# Patient Record
Sex: Female | Born: 1960 | Race: Black or African American | Hispanic: No | Smoking: Never smoker
Health system: Southern US, Community
[De-identification: ages and names within clinical notes are randomized; demographics above are authoritative.]

## PROBLEM LIST (undated history)

## (undated) DIAGNOSIS — R51 Headache: Secondary | ICD-10-CM

## (undated) DIAGNOSIS — F419 Anxiety disorder, unspecified: Secondary | ICD-10-CM

## (undated) DIAGNOSIS — E669 Obesity, unspecified: Secondary | ICD-10-CM

## (undated) DIAGNOSIS — R200 Anesthesia of skin: Secondary | ICD-10-CM

## (undated) DIAGNOSIS — Z8701 Personal history of pneumonia (recurrent): Secondary | ICD-10-CM

## (undated) DIAGNOSIS — Z9889 Other specified postprocedural states: Secondary | ICD-10-CM

## (undated) DIAGNOSIS — Z9119 Patient's noncompliance with other medical treatment and regimen: Secondary | ICD-10-CM

## (undated) DIAGNOSIS — E039 Hypothyroidism, unspecified: Secondary | ICD-10-CM

## (undated) DIAGNOSIS — E785 Hyperlipidemia, unspecified: Secondary | ICD-10-CM

## (undated) DIAGNOSIS — R112 Nausea with vomiting, unspecified: Secondary | ICD-10-CM

## (undated) DIAGNOSIS — M1990 Unspecified osteoarthritis, unspecified site: Secondary | ICD-10-CM

## (undated) DIAGNOSIS — Z8659 Personal history of other mental and behavioral disorders: Secondary | ICD-10-CM

## (undated) DIAGNOSIS — E7800 Pure hypercholesterolemia, unspecified: Secondary | ICD-10-CM

## (undated) DIAGNOSIS — R202 Paresthesia of skin: Secondary | ICD-10-CM

## (undated) DIAGNOSIS — T8859XA Other complications of anesthesia, initial encounter: Secondary | ICD-10-CM

## (undated) DIAGNOSIS — I10 Essential (primary) hypertension: Secondary | ICD-10-CM

## (undated) DIAGNOSIS — T4145XA Adverse effect of unspecified anesthetic, initial encounter: Secondary | ICD-10-CM

## (undated) DIAGNOSIS — K219 Gastro-esophageal reflux disease without esophagitis: Secondary | ICD-10-CM

## (undated) DIAGNOSIS — R002 Palpitations: Secondary | ICD-10-CM

## (undated) DIAGNOSIS — Z8719 Personal history of other diseases of the digestive system: Secondary | ICD-10-CM

## (undated) DIAGNOSIS — K859 Acute pancreatitis without necrosis or infection, unspecified: Secondary | ICD-10-CM

## (undated) DIAGNOSIS — Z91199 Patient's noncompliance with other medical treatment and regimen due to unspecified reason: Secondary | ICD-10-CM

## (undated) DIAGNOSIS — R519 Headache, unspecified: Secondary | ICD-10-CM

## (undated) HISTORY — DX: Obesity, unspecified: E66.9

## (undated) HISTORY — DX: Hyperlipidemia, unspecified: E78.5

## (undated) HISTORY — DX: Hypothyroidism, unspecified: E03.9

## (undated) HISTORY — DX: Anxiety disorder, unspecified: F41.9

## (undated) HISTORY — DX: Patient's noncompliance with other medical treatment and regimen: Z91.19

## (undated) HISTORY — DX: Patient's noncompliance with other medical treatment and regimen due to unspecified reason: Z91.199

## (undated) HISTORY — DX: Acute pancreatitis without necrosis or infection, unspecified: K85.90

## (undated) HISTORY — PX: ESOPHAGOGASTRODUODENOSCOPY: SHX1529

## (undated) HISTORY — DX: Palpitations: R00.2

## (undated) HISTORY — DX: Pure hypercholesterolemia, unspecified: E78.00

## (undated) HISTORY — DX: Essential (primary) hypertension: I10

---
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
## 1982-07-10 HISTORY — PX: TONSILLECTOMY: SUR1361

## 1989-07-10 HISTORY — PX: TOTAL ABDOMINAL HYSTERECTOMY: SHX209

## 1995-07-11 DIAGNOSIS — Z8701 Personal history of pneumonia (recurrent): Secondary | ICD-10-CM

## 1995-07-11 HISTORY — DX: Personal history of pneumonia (recurrent): Z87.01

## 2003-07-11 HISTORY — PX: CARDIAC CATHETERIZATION: SHX172

## 2004-10-27 ENCOUNTER — Encounter: Admission: RE | Admit: 2004-10-27 | Discharge: 2004-12-12 | Payer: Self-pay | Admitting: Family Medicine

## 2004-11-04 ENCOUNTER — Encounter: Admission: RE | Admit: 2004-11-04 | Discharge: 2004-11-04 | Payer: Self-pay | Admitting: Internal Medicine

## 2005-02-07 ENCOUNTER — Emergency Department (HOSPITAL_COMMUNITY): Admission: EM | Admit: 2005-02-07 | Discharge: 2005-02-07 | Payer: Self-pay | Admitting: Emergency Medicine

## 2006-07-10 DIAGNOSIS — Z8659 Personal history of other mental and behavioral disorders: Secondary | ICD-10-CM

## 2006-07-10 HISTORY — DX: Personal history of other mental and behavioral disorders: Z86.59

## 2006-11-22 ENCOUNTER — Emergency Department (HOSPITAL_COMMUNITY): Admission: EM | Admit: 2006-11-22 | Discharge: 2006-11-23 | Payer: Self-pay | Admitting: Emergency Medicine

## 2007-08-01 ENCOUNTER — Ambulatory Visit: Payer: Self-pay

## 2007-08-16 ENCOUNTER — Emergency Department: Payer: Self-pay | Admitting: Emergency Medicine

## 2008-01-30 ENCOUNTER — Ambulatory Visit: Payer: Self-pay | Admitting: Internal Medicine

## 2008-06-08 ENCOUNTER — Ambulatory Visit: Payer: Self-pay | Admitting: Internal Medicine

## 2008-07-25 ENCOUNTER — Emergency Department: Payer: Self-pay | Admitting: Emergency Medicine

## 2008-07-29 ENCOUNTER — Ambulatory Visit: Payer: Self-pay | Admitting: Internal Medicine

## 2008-07-30 ENCOUNTER — Ambulatory Visit: Payer: Self-pay | Admitting: Internal Medicine

## 2009-06-16 ENCOUNTER — Ambulatory Visit: Payer: Self-pay | Admitting: Internal Medicine

## 2009-06-16 DIAGNOSIS — I10 Essential (primary) hypertension: Secondary | ICD-10-CM

## 2009-06-16 DIAGNOSIS — R5383 Other fatigue: Secondary | ICD-10-CM

## 2009-06-16 DIAGNOSIS — E785 Hyperlipidemia, unspecified: Secondary | ICD-10-CM | POA: Insufficient documentation

## 2009-06-16 DIAGNOSIS — R002 Palpitations: Secondary | ICD-10-CM

## 2009-06-16 DIAGNOSIS — R5381 Other malaise: Secondary | ICD-10-CM

## 2009-06-20 ENCOUNTER — Emergency Department (HOSPITAL_COMMUNITY): Admission: EM | Admit: 2009-06-20 | Discharge: 2009-06-20 | Payer: Self-pay | Admitting: Emergency Medicine

## 2009-06-22 ENCOUNTER — Ambulatory Visit: Payer: Self-pay

## 2009-06-22 ENCOUNTER — Ambulatory Visit: Payer: Self-pay | Admitting: Cardiovascular Disease

## 2009-06-22 ENCOUNTER — Encounter: Payer: Self-pay | Admitting: Internal Medicine

## 2009-07-06 LAB — CONVERTED CEMR LAB
BUN: 13 mg/dL (ref 6–23)
CO2: 26 meq/L (ref 19–32)
Chloride: 102 meq/L (ref 96–112)
Cholesterol: 278 mg/dL — ABNORMAL HIGH (ref 0–200)
Creatinine, Ser: 0.88 mg/dL (ref 0.40–1.20)
Glucose, Bld: 100 mg/dL — ABNORMAL HIGH (ref 70–99)
HDL: 57 mg/dL (ref >39)
MCV: 95.8 fL (ref 78.0–100.0)
Platelets: 194 10*3/uL (ref 150–400)
RBC: 4.54 M/uL (ref 3.87–5.11)
TSH: 4.108 microintl units/mL (ref 0.350–4.500)
Total Bilirubin: 0.7 mg/dL (ref 0.3–1.2)
Total CHOL/HDL Ratio: 4.9
Total Protein: 7.7 g/dL (ref 6.0–8.3)
Triglycerides: 137 mg/dL (ref <150)
WBC: 6.2 10*3/uL (ref 4.0–10.5)

## 2009-07-10 HISTORY — PX: COLONOSCOPY: SHX174

## 2010-01-20 ENCOUNTER — Encounter: Admission: RE | Admit: 2010-01-20 | Discharge: 2010-01-20 | Payer: Self-pay | Admitting: Family Medicine

## 2010-12-07 ENCOUNTER — Emergency Department (HOSPITAL_COMMUNITY)
Admission: EM | Admit: 2010-12-07 | Discharge: 2010-12-07 | Disposition: A | Discharge status: Home / Self Care | Payer: Worker's Compensation | Attending: Emergency Medicine | Admitting: Emergency Medicine

## 2010-12-07 DIAGNOSIS — R05 Cough: Secondary | ICD-10-CM | POA: Insufficient documentation

## 2010-12-07 DIAGNOSIS — J45909 Unspecified asthma, uncomplicated: Secondary | ICD-10-CM | POA: Insufficient documentation

## 2010-12-07 DIAGNOSIS — I10 Essential (primary) hypertension: Secondary | ICD-10-CM | POA: Insufficient documentation

## 2010-12-07 DIAGNOSIS — R0602 Shortness of breath: Secondary | ICD-10-CM | POA: Insufficient documentation

## 2010-12-07 DIAGNOSIS — R059 Cough, unspecified: Secondary | ICD-10-CM | POA: Insufficient documentation

## 2011-01-26 ENCOUNTER — Encounter: Payer: Self-pay | Admitting: Cardiovascular Disease

## 2011-04-03 ENCOUNTER — Ambulatory Visit
Admission: RE | Admit: 2011-04-03 | Discharge: 2011-04-03 | Disposition: A | Discharge status: Home / Self Care | Payer: Worker's Compensation | Source: Ambulatory Visit | Attending: Internal Medicine | Admitting: Internal Medicine

## 2011-04-03 ENCOUNTER — Other Ambulatory Visit: Payer: Self-pay | Admitting: *Deleted

## 2011-04-03 DIAGNOSIS — R05 Cough: Secondary | ICD-10-CM

## 2011-04-03 DIAGNOSIS — R0602 Shortness of breath: Secondary | ICD-10-CM

## 2011-04-10 DIAGNOSIS — K859 Acute pancreatitis without necrosis or infection, unspecified: Secondary | ICD-10-CM

## 2011-04-10 HISTORY — DX: Acute pancreatitis without necrosis or infection, unspecified: K85.90

## 2011-04-25 ENCOUNTER — Inpatient Hospital Stay: Payer: Self-pay | Admitting: Student

## 2011-05-04 ENCOUNTER — Encounter: Payer: Self-pay | Admitting: Gastroenterology

## 2011-05-04 ENCOUNTER — Ambulatory Visit (INDEPENDENT_AMBULATORY_CARE_PROVIDER_SITE_OTHER): Payer: 59 | Admitting: Gastroenterology

## 2011-05-04 VITALS — BP 138/84 | HR 71 | Temp 97.1°F | Ht 63.0 in | Wt 247.2 lb

## 2011-05-04 DIAGNOSIS — K859 Acute pancreatitis without necrosis or infection, unspecified: Secondary | ICD-10-CM

## 2011-05-04 DIAGNOSIS — R197 Diarrhea, unspecified: Secondary | ICD-10-CM

## 2011-05-04 DIAGNOSIS — R7989 Other specified abnormal findings of blood chemistry: Secondary | ICD-10-CM

## 2011-05-04 DIAGNOSIS — R945 Abnormal results of liver function studies: Secondary | ICD-10-CM | POA: Insufficient documentation

## 2011-05-04 NOTE — Assessment & Plan Note (Signed)
Recent change in bowels with increased stool frequency, loose stools. In the setting of recent pancreatitis. 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 She will monitor her symptoms and if persisting over the next week, consider stool studies.

## 2011-05-04 NOTE — Patient Instructions (Addendum)
We will get copy of recent labs. I will review your records with Dr. Jena Gauss and make further recommendations.  Please call with worsening or persistent abdominal pain, fever, persistent diarrhea.

## 2011-05-04 NOTE — Assessment & Plan Note (Signed)
Acute pancreatitis requiring hospitalization on 04/25/2011. Lipase was over 1100. Minimally elevated AST and ALT, patient reports having that previously. Abdominal ultrasound showed a normal-appearing liver, pancreas not completely seen but part seen was normal, no gallstones, normal common bile duct diameter. She has started HCTZ back in August however states that she was somewhat noncompliant. This may be the source of her acute pancreatitis. She denies alcohol use, calcium level was normal. No family history of pancreatitis. Interestingly she has had intermittent abdominal pain for the past 2 months it is unclear whether these symptoms were related to her pancreas. Clinically she appears to be improved at this point. She will need to have further detailing of her pancreas to exclude pancreas divisum, pancreatic mass, consider excluding microlithiasis as well. She may very well need EGD to rule out other sources for chronic abdominal pain such as peptic ulcer disease or gastritis. I will re\re review recent lab work done after her hospitalization by her PCP. I will discuss these findings with Dr.Rourk and further recommendations to follow.  She will continue pantoprazole for now. She will continue low-fat diet. She's been instructed to call us with worsening abdominal pain, persistent diarrhea, fever, vomiting, melena, rectal bleeding.

## 2011-05-04 NOTE — Progress Notes (Signed)
Primary Care Physician:  Kermit Balo., DO  Primary Gastroenterologist:  Roetta Sessions, MD   Chief Complaint  Patient presents with  . Pancreatitis    HPI:  Joan Salazar is a 50 y.o. female here for further evaluation of recent diagnosis of acute pancreatitis requiring hospitalization at [facility]. She gives a two-month history of intermittent abdominal pain and gas. Burning into chest. Not like heartburn. Cardiac cath ok, 2005. No dysphagia. She states that her stomach were hurt when she thought about work. She admits to being under a lot of stress. About 2 weeks ago her pain intensified and she went to urgent care who transferred her to [facility]. She was diagnosed with acute pancreatitis. Her lipase was over 1000. Abdominal ultrasound was unremarkable. It's noted that she had started HCTZ and August but she states she has been somewhat noncompliant with the medication. It was felt that her pancreatitis may be due to the HCTZ. Her triglyceride level and calcium level both were normal. She does not consume alcohol.   She states over the last 2 months she's had increasing burning in the epigastrium. Symptoms worse with food. She was started on Protonix about 2 weeks prior to her hospitalization. She is also noted a change in her bowel movements prior to her hospitalization.  She went from having one to 2 stools a day up to 5-7 stools a day. She works at a long-term care facility as a Administrator, arts. Denies melena, rectal bleeding, fever.  Last antibiotics in 02/2011 for sinus infection. Bag of Levaquin during recent hospitalization.   Current Outpatient Prescriptions  Medication Sig Dispense Refill  . amLODipine (NORVASC) 10 MG tablet Take 10 mg by mouth daily.       Marland Kitchen levothyroxine (SYNTHROID, LEVOTHROID) 50 MCG tablet Take 50 mcg by mouth daily.        Marland Kitchen losartan (COZAAR) 50 MG tablet Take 50 mg by mouth daily.       . pantoprazole  (PROTONIX) 40 MG tablet Take 40 mg by mouth daily.       . VENTOLIN HFA 108 (90 BASE) MCG/ACT inhaler Inhale 1 puff into the lungs every 4 (four) hours as needed.         Allergies as of 05/04/2011  . (No Known Allergies)    Past Medical History  Diagnosis Date  . Hypertension   . Hyperlipidemia     stopped statins due to muscle pain  . Obesity   . Noncompliance   . Palpitations   . Hypothyroidism   . Acute pancreatitis 04/2011    ?secondary to HCTZ.     Past Surgical History  Procedure Date  . 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 Total abdominal hysterectomy 1991  . Tonsillectomy 1984  . Appendectomy 1979  . Colonoscopy 2011    Dr. Jeani Hawking. Per pt, colon polyps and due in five years for f/u.  Marland Kitchen Esophagogastroduodenoscopy 1990s    duodenitis per patient    Family History  Problem Relation Age of Onset  . Heart attack Mother   . Hypertension Father   . Hyperlipidemia Father   . Hypertension Sister   . Colon cancer Neg Hx   . Liver disease Neg Hx     History   Social History  . Marital Status: Divorced    Spouse Name: N/A    Number of Children: 1  . Years of Education: N/A   Occupational  History  . RN DIRECTOR  CLINICAL EDUCATION     [employer]   Social History Main Topics  . Smoking status: Never Smoker   . Smokeless tobacco: Not on file  . Alcohol Use: No  . Drug Use: No  . Sexually Active: Not on file   Other Topics Concern  . Not on file   Social History Narrative   Lives in [place].Has 1 son (age 17).      ROS:  General: Negative for anorexia, weight loss, fever, chills, fatigue, weakness. Eyes: Negative for vision changes.  ENT: Negative for hoarseness, difficulty swallowing , nasal congestion. CV: Negative for chest pain, angina, palpitations, dyspnea on exertion, peripheral edema.  Respiratory: Negative for dyspnea at rest, dyspnea on exertion, cough, sputum, wheezing.  GI:  See history of present illness. GU:  Negative for dysuria, hematuria, urinary incontinence, urinary frequency, nocturnal urination.  MS: Negative for joint pain, low back pain.  Derm: Negative for rash or itching.  Neuro: Negative for weakness, abnormal sensation, seizure, frequent headaches, memory loss, confusion.  Psych: Negative for anxiety, depression, suicidal ideation, hallucinations. Complains of being stressed out. Endo: Negative for unusual weight change.  Heme: Negative for bruising or bleeding. Allergy: Negative for rash or hives.    Physical Examination:  BP 138/84  Pulse 71  Temp(Src) 97.1 F (36.2 C) (Temporal)  Ht 5\' 3"  (1.6 m)  Wt 247 lb 3.2 oz (112.129 kg)  BMI 43.79 kg/m2   General: Well-nourished, well-developed in no acute distress.  Head: Normocephalic, atraumatic.   Eyes: Conjunctiva pink, no icterus. Mouth: Oropharyngeal mucosa moist and pink , no lesions erythema or exudate. Neck: Supple without thyromegaly, masses, or lymphadenopathy.  Lungs: Clear to auscultation bilaterally.  Heart: Regular rate and rhythm, no murmurs rubs or gallops.  Abdomen: Bowel sounds are normal, mild epigastric/right upper quadrant tenderness, nondistended, no hepatosplenomegaly or masses, no abdominal bruits or    hernia , no rebound or guarding.   Rectal: Not performed. Extremities: No lower extremity edema. No clubbing or deformities.  Neuro: Alert and oriented x 4 , grossly normal neurologically.  Skin: Warm and dry, no rash or jaundice.   Psych: Alert and cooperative, normal mood and affect.  Labs: Labs from 04/25/2011. White blood cell count 8100, hemoglobin 13.4, platelets 238, lipase 1110, glucose 85, BUN 10, creatinine 0.94, sodium 146, potassium 3.5, calcium 9, AST 43, ALT 43, albumin 4.1, total bilirubin 0.8. Labs from 04/26/2011. AST 41 ALT 46, alkaline phosphatase 67, total bilirubin 0.9, triglycerides 112, lipase 690. TSH 1.64.  Imaging Studies: Abdominal  ultrasound showed no gallstones or acute cholecystitis. Visualized portion of the pancreas was normal in echogenicity. Common bile duct 4.8 mm.

## 2011-05-04 NOTE — Assessment & Plan Note (Signed)
Minimally elevated AST, ALT. No obvious fatty liver on recent ultrasound. History of statin use in the past. Recheck in the near future and if remain elevated will continue workup.

## 2011-05-05 NOTE — Progress Notes (Signed)
Cc to PCP 

## 2011-05-08 ENCOUNTER — Telehealth: Payer: Self-pay | Admitting: Gastroenterology

## 2011-05-08 NOTE — Telephone Encounter (Signed)
Tried to call pt- NA 

## 2011-05-08 NOTE — Telephone Encounter (Signed)
Pt called, asking to speak to LL- she stated she was still having nausea & epigastric pain- please call and advise her on what she should do- she can be reached at [phone]

## 2011-05-09 ENCOUNTER — Other Ambulatory Visit: Payer: Self-pay | Admitting: Internal Medicine

## 2011-05-09 DIAGNOSIS — K861 Other chronic pancreatitis: Secondary | ICD-10-CM

## 2011-05-09 NOTE — Telephone Encounter (Signed)
Gastroenterology Phone Call Form  Primary Physician: rourk  1) Reason for phone call: epigastric pain and burning. Just like at ov.   2) When did this start? Still having the same symptoms since ov   3) Have you been seen for this here before?  yes    4) If Pain, where is it located?  the epigastrium   5) Rate your pain/discomfort on scale 1-10? 5   6) Have you tried any medication for this?yes  protonix   7) Do you have any trouble breathing, chest pain, vomiting blood, passing bloody stools, feel dehydrated, dizzy or have a high fever?no   Pt going to see PCP today. Wants to know if you have talked with RMR yet.

## 2011-05-09 NOTE — Telephone Encounter (Signed)
I was waiting for her last labs but never received them.  Let's have her repeat a lipase, CMET, CBC.

## 2011-05-10 ENCOUNTER — Ambulatory Visit
Admission: RE | Admit: 2011-05-10 | Discharge: 2011-05-10 | Disposition: A | Discharge status: Home / Self Care | Payer: 59 | Source: Ambulatory Visit | Attending: Internal Medicine | Admitting: Internal Medicine

## 2011-05-10 DIAGNOSIS — K861 Other chronic pancreatitis: Secondary | ICD-10-CM

## 2011-05-10 MED ORDER — IOHEXOL 300 MG/ML  SOLN: 125.0000 mL | Freq: Once | INTRAMUSCULAR | Status: AC | PRN

## 2011-05-10 NOTE — Telephone Encounter (Signed)
Spoke with pt, she was not home yesterday because she was at pcp. She said they sent her for an abd ct in [place]. She said it was imaging place on [address]. Advised pt that we would not get a copy of ct and asked that she request it be sent to [place].  Pt will go for labs. She requested to go to lab closer to [place], called [facility], the closest one is in [place] on [address]. Advised pt, she said she knew where it was at and would go have it done. [facility] lab, they said to fax order over and they would take care of it. Order faxed to  [phone]

## 2011-05-10 NOTE — Telephone Encounter (Signed)
Have we made contact with patient?

## 2011-05-11 ENCOUNTER — Other Ambulatory Visit: Payer: Self-pay | Admitting: Gastroenterology

## 2011-05-12 LAB — COMPREHENSIVE METABOLIC PANEL
AST: 39 U/L — ABNORMAL HIGH (ref 0–37)
BUN: 7 mg/dL (ref 6–23)
CO2: 24 mEq/L (ref 19–32)
Calcium: 9.7 mg/dL (ref 8.4–10.5)
Chloride: 105 mEq/L (ref 96–112)
Creat: 0.77 mg/dL (ref 0.50–1.10)
Total Bilirubin: 0.7 mg/dL (ref 0.3–1.2)

## 2011-05-12 LAB — CBC WITH DIFFERENTIAL/PLATELET
Basophils Absolute: 0 10*3/uL (ref 0.0–0.1)
Basophils Relative: 0 % (ref 0–1)
Eosinophils Relative: 3 % (ref 0–5)
HCT: 43.7 % (ref 36.0–46.0)
Lymphs Abs: 1.9 10*3/uL (ref 0.7–4.0)
Monocytes Absolute: 0.5 10*3/uL (ref 0.1–1.0)
Monocytes Relative: 7 % (ref 3–12)
RBC: 4.52 MIL/uL (ref 3.87–5.11)
RDW: 13.6 % (ref 11.5–15.5)

## 2011-05-12 LAB — LIPASE: Lipase: 34 U/L (ref 0–75)

## 2011-05-16 ENCOUNTER — Encounter: Payer: Self-pay | Admitting: Urgent Care

## 2011-05-16 ENCOUNTER — Ambulatory Visit (INDEPENDENT_AMBULATORY_CARE_PROVIDER_SITE_OTHER): Payer: 59 | Admitting: Urgent Care

## 2011-05-16 DIAGNOSIS — K859 Acute pancreatitis without necrosis or infection, unspecified: Secondary | ICD-10-CM

## 2011-05-16 DIAGNOSIS — R109 Unspecified abdominal pain: Secondary | ICD-10-CM | POA: Insufficient documentation

## 2011-05-16 MED ORDER — PANTOPRAZOLE SODIUM 40 MG PO TBEC: 40.0000 mg | DELAYED_RELEASE_TABLET | Freq: Two times a day (BID) | ORAL | Status: DC

## 2011-05-16 NOTE — Patient Instructions (Addendum)
Be sure to get a mammogram through REED,TIFFANY L., DO as one breast was a bit irregular on CT Go get your labs now Clear liquids for 24 hrs EGD as planned You may use dilaudid you have at home for pain medication as directed  Abdominal Pain Abdominal pain can be caused by many things. Your caregiver decides the seriousness of your pain by an examination and possibly blood tests and X-rays. Many cases can be observed and treated at home. Most abdominal pain is not caused by a disease and will probably improve without treatment. However, in many cases, more time must pass before a clear cause of the pain can be found. Before that point, it may not be known if you need more testing, or if hospitalization or surgery is needed. HOME CARE INSTRUCTIONS   Do not take laxatives unless directed by your caregiver.   Take pain medicine only as directed by your caregiver.   Only take over-the-counter or prescription medicines for pain, discomfort, or fever as directed by your caregiver.   Try a clear liquid diet (broth, tea, or water) for as long as directed by your caregiver. Slowly move to a bland diet as tolerated.  SEEK IMMEDIATE MEDICAL CARE IF:   The pain does not go away.   You have a fever.   You keep throwing up (vomiting).   The pain is felt only in portions of the abdomen. Pain in the right side could possibly be appendicitis. In an adult, pain in the left lower portion of the abdomen could be colitis or diverticulitis.   You pass bloody or black tarry stools.  MAKE SURE YOU:   Understand these instructions.   Will watch your condition.   Will get help right away if you are not doing well or get worse.  Document Released: 04/05/2005 Document Revised: 03/08/2011 Document Reviewed: 02/12/2008 [facility] Patient Information 2012 [place], [place].

## 2011-05-16 NOTE — Assessment & Plan Note (Addendum)
Recurrent abdominal pain. Mostly right upper quadrant/epigastric area. Recent acute pancreatitis elevated lipase,however this was not confirmed on imaging. Gallbladder and pancreas were normal on ultrasound and CT. Nothing to explain pain. She is going to need further evaluation with EGD to rule out peptic ulcer disease, gastritis and duodenitis.  I have discussed risks & benefits which include, but are not limited to, bleeding, infection, perforation & drug reaction.  The patient agrees with this plan & written consent will be obtained.    Recheck CBC, LFTs, and lipase now Clear liquid diet Increase Protonix to 40 mg BID

## 2011-05-16 NOTE — Progress Notes (Signed)
Cc to PCP 

## 2011-05-16 NOTE — Progress Notes (Signed)
Referring Provider: Kermit Balo., DO Primary Care Physician:  Kermit Balo., DO Primary Gastroenterologist:  Dr. Jena Gauss  Chief Complaint  Patient presents with  . Abdominal Pain    with back pain  . Pancreatitis    HPI:  Joan Salazar is a 50 y.o. female here for follow up for abdominal pain.  Hx acute pancreatitis requiring hospitalization on 04/25/2011-04/28/2011 at [facility].  Lipase was over 1100. Minimally elevated AST and ALT.  Recent lipase, LFTs, CBC & CT abd/pelvis w/ IV contrast do not show any pancreatic changes.  Previous abdominal ultrasound showed a normal-appearing liver, pancreas not completely seen but part seen was normal, no gallstones, normal common bile duct diameter. Pancreatitis was felt to possibly be due to HCTZ.   This has since been discontinued.  She denies alcohol use.  Was seen at PCP and referred back because of her severe pain today.  C/o chronic RUQ pain radiates to back.  Pain 3/10 on scale. 7/10 at worst.  Constant.  Not associated w/ meals.  No fever or chills.  C/o a little nausea, without vomiting.  Denies rectal bleeding or melena.  Heartburn yesterday. Usually heartburn 2-3 episodes per week.  Started taking protonix 40mg  daily 2 weeks ago.  Some help w/ heartburn, but not pain.   Denies dysphagia or odyynophagia.  BM every day 2-3 per day.  No constipation.  Loose stools.  05/10/11 CT abdomen and pelvis with IV contrast->Fatty infiltration of the liver. No pancreatic mass or pancreatic inflammation detected. Atherosclerotic type changes aorta and iliac arteries. Degenerative changes lower lumbar spine.  Prominent lobulation/scarring upper pole of the left kidney. Slightly asymmetric breast tissue. Correlation mammography recommended.  Lab Summary Latest Ref Rng 05/11/2011  Hemoglobin 12.0 - 15.0 g/dL 40.9  Hematocrit 81.1 - 46.0 % 43.7  White count 4.0 - 10.5 K/uL 7.8  Platelet count 150 - 400 K/uL 263  Sodium 135 - 145 mEq/L 142  Potassium 3.5 - 5.3  mEq/L 4.1  Calcium 8.4 - 10.5 mg/dL 9.7  Phosphorus  (None)  Creatinine 0.50 - 1.10 mg/dL 9.14  AST 0 - 37 U/L 39 (H)  Alk Phos 39 - 117 U/L 78  Bilirubin 0.3 - 1.2 mg/dL 0.7  Glucose 70 - 99 mg/dL 782 (H)  Cholesterol 9-562 mg/dL (None)  HDL cholesterol >39 mg/dL (None)  Triglycerides <130 mg/dL (None)  LDL calc  (None)  LDL direct  (None)  Total protein 6.0 - 8.3 g/dL 7.5  Albumin 3.5 - 5.2 g/dL 4.6   Past Medical History  Diagnosis Date  . Hypertension   . Hyperlipidemia     stopped statins due to muscle pain  . Obesity   . Noncompliance   . Palpitations   . Hypothyroidism   . Acute pancreatitis 04/2011    ?secondary to HCTZ.     Past Surgical History  Procedure Date  . 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 Total abdominal hysterectomy 1991  . Tonsillectomy 1984  . Appendectomy 1979  . Colonoscopy 2011    Dr. Jeani Hawking. Per pt, colon polyps and due in five years for f/u.  Marland Kitchen Esophagogastroduodenoscopy 1990s    duodenitis per patient    Current Outpatient Prescriptions  Medication Sig Dispense Refill  . amLODipine (NORVASC) 10 MG tablet Take 10 mg by mouth daily.       Marland Kitchen HYDROmorphone (DILAUDID) 2 MG tablet Take 2 mg by mouth every 6 (six) hours as needed.        Marland Kitchen  levothyroxine (SYNTHROID, LEVOTHROID) 50 MCG tablet Take 50 mcg by mouth daily.        Marland Kitchen losartan (COZAAR) 50 MG tablet Take 50 mg by mouth daily.       . ondansetron (ZOFRAN) 4 MG tablet Take 4 mg by mouth every 8 (eight) hours as needed.       . pantoprazole (PROTONIX) 40 MG tablet Take 40 mg by mouth daily.       . VENTOLIN HFA 108 (90 BASE) MCG/ACT inhaler Inhale 1 puff into the lungs every 4 (four) hours as needed.         Allergies as of 05/16/2011  . (No Known Allergies)   Family History  Problem Relation Age of Onset  . Heart attack Mother   . Hypertension Father   . Hyperlipidemia Father   . Hypertension Sister   . Colon cancer Neg Hx     . Liver disease Neg Hx   . Ulcerative colitis Mother    Past Surgical History  Procedure Date  . 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 Total abdominal hysterectomy 1991  . Tonsillectomy 1984  . Appendectomy 1979  . Colonoscopy 2011    Dr. Jeani Hawking. Per pt, colon polyps and due in five years for f/u.  Marland Kitchen Esophagogastroduodenoscopy 1990s    duodenitis per patient   Review of Systems: See history of present illness, otherwise negative review of systems  Physical Exam: Pulse 98  Temp(Src) 97.6 F (36.4 C) (Temporal)  Ht 5\' 3"  (1.6 m)  Wt 247 lb 3.2 oz (112.129 kg)  BMI 43.79 kg/m2 General:   Alert,  Well-developed, obese, pleasant and cooperative in NAD Head:  Normocephalic and atraumatic. Eyes:  Sclera clear, no icterus.   Conjunctiva pink. Mouth:  No deformity or lesions, oropharynx pink and moist. Neck:  Supple; no masses or thyromegaly. Heart:  Regular rate and rhythm; no murmurs, clicks, rubs,  or gallops. Abdomen:  Soft and nondistended. Mild tenderness to palpation of right upper cautery and epigastrium.  No masses, hepatosplenomegaly or hernias noted. Normal bowel sounds, without guarding, and without rebound.   Msk:  Symmetrical without gross deformities. Normal posture. Pulses:  Normal pulses noted. Extremities:  Without clubbing or edema. Neurologic:  Alert and  oriented x4;  grossly normal neurologically. Skin:  Intact without significant lesions or rashes. Cervical Nodes:  No significant cervical adenopathy. Psych:  Alert and cooperative. Normal mood and affect.

## 2011-05-17 LAB — HEPATIC FUNCTION PANEL
AST: 37 U/L (ref 0–37)
Alkaline Phosphatase: 82 U/L (ref 39–117)
Bilirubin, Direct: 0.2 mg/dL (ref 0.0–0.3)
Indirect Bilirubin: 0.6 mg/dL (ref 0.0–0.9)

## 2011-05-17 LAB — CBC WITH DIFFERENTIAL/PLATELET
Basophils Absolute: 0 10*3/uL (ref 0.0–0.1)
Eosinophils Absolute: 0.3 10*3/uL (ref 0.0–0.7)
Eosinophils Relative: 2 % (ref 0–5)
HCT: 43.4 % (ref 36.0–46.0)
MCHC: 32.9 g/dL (ref 30.0–36.0)
MCV: 93.9 fL (ref 78.0–100.0)
RDW: 13.3 % (ref 11.5–15.5)

## 2011-05-17 NOTE — Progress Notes (Signed)
Quick Note:  Patient aware, seen 05/16/11. LFTS normal on 05/16/11. ______

## 2011-05-17 NOTE — Telephone Encounter (Signed)
Patient seen in office 05/16/11.

## 2011-05-17 NOTE — Progress Notes (Signed)
Quick Note:  Discussed results w/ pt. C/o lots of diarrhea. She has stool study orders from PCP & plans on going to [facility] in [place]. Please be sure they have the following, if not we will need to order: 1) c diff by PCR 2) giardia 3) lactoferrin 4) culture & sensitivity Keep EGD as planned. Thanks ______

## 2011-05-18 ENCOUNTER — Encounter (HOSPITAL_COMMUNITY): Payer: Self-pay | Admitting: Pharmacy Technician

## 2011-05-18 ENCOUNTER — Ambulatory Visit: Payer: Self-pay | Admitting: Gastroenterology

## 2011-05-24 ENCOUNTER — Encounter (HOSPITAL_COMMUNITY): Payer: Self-pay | Admitting: *Deleted

## 2011-05-24 ENCOUNTER — Encounter (HOSPITAL_COMMUNITY)
Admission: RE | Disposition: A | Discharge status: Home / Self Care | Payer: Self-pay | Source: Ambulatory Visit | Attending: Internal Medicine

## 2011-05-24 ENCOUNTER — Ambulatory Visit (HOSPITAL_COMMUNITY)
Admission: RE | Admit: 2011-05-24 | Discharge: 2011-05-24 | Disposition: A | Discharge status: Home / Self Care | Payer: 59 | Source: Ambulatory Visit | Attending: Internal Medicine | Admitting: Internal Medicine

## 2011-05-24 DIAGNOSIS — R1013 Epigastric pain: Secondary | ICD-10-CM | POA: Insufficient documentation

## 2011-05-24 DIAGNOSIS — Z79899 Other long term (current) drug therapy: Secondary | ICD-10-CM | POA: Insufficient documentation

## 2011-05-24 DIAGNOSIS — I10 Essential (primary) hypertension: Secondary | ICD-10-CM | POA: Insufficient documentation

## 2011-05-24 DIAGNOSIS — E785 Hyperlipidemia, unspecified: Secondary | ICD-10-CM | POA: Insufficient documentation

## 2011-05-24 HISTORY — PX: ESOPHAGOGASTRODUODENOSCOPY: SHX5428

## 2011-05-24 SURGERY — EGD (ESOPHAGOGASTRODUODENOSCOPY)
Anesthesia: Moderate Sedation

## 2011-05-24 MED ORDER — MIDAZOLAM HCL 5 MG/5ML IJ SOLN
INTRAMUSCULAR | Status: AC
  Filled 2011-05-24: qty 10

## 2011-05-24 MED ORDER — SIMETHICONE 40 MG/0.6ML PO SUSP
ORAL | Status: DC | PRN
  Administered 2011-05-24: 14:00:00

## 2011-05-24 MED ORDER — MIDAZOLAM HCL 5 MG/5ML IJ SOLN
INTRAMUSCULAR | Status: DC | PRN
  Administered 2011-05-24: 1 mg via INTRAVENOUS
  Administered 2011-05-24 (×2): 2 mg via INTRAVENOUS

## 2011-05-24 MED ORDER — MEPERIDINE HCL 100 MG/ML IJ SOLN
INTRAMUSCULAR | Status: DC | PRN
  Administered 2011-05-24 (×2): 50 mg via INTRAVENOUS

## 2011-05-24 MED ORDER — MEPERIDINE HCL 100 MG/ML IJ SOLN
INTRAMUSCULAR | Status: AC
  Filled 2011-05-24: qty 2

## 2011-05-24 MED ORDER — BUTAMBEN-TETRACAINE-BENZOCAINE 2-2-14 % EX AERO
INHALATION_SPRAY | CUTANEOUS | Status: DC | PRN
  Administered 2011-05-24: 2 via TOPICAL

## 2011-05-24 MED ORDER — SODIUM CHLORIDE 0.45 % IV SOLN
Freq: Once | INTRAVENOUS | Status: AC
  Administered 2011-05-24: 14:00:00 via INTRAVENOUS

## 2011-05-24 NOTE — H&P (Addendum)
  I have seen & examined the patient prior to the procedure(s) today and reviewed the history and physical/consultation.  There have been no changes.  After consideration of the risks, benefits, alternatives and imponderables, the patient has consented to the procedure(s).  Late entry 

## 2011-05-29 DIAGNOSIS — R1011 Right upper quadrant pain: Secondary | ICD-10-CM

## 2011-05-29 DIAGNOSIS — K449 Diaphragmatic hernia without obstruction or gangrene: Secondary | ICD-10-CM

## 2011-05-30 ENCOUNTER — Other Ambulatory Visit: Payer: Self-pay | Admitting: Gastroenterology

## 2011-05-30 ENCOUNTER — Telehealth: Payer: Self-pay

## 2011-05-30 ENCOUNTER — Telehealth: Payer: Self-pay | Admitting: Gastroenterology

## 2011-05-30 ENCOUNTER — Telehealth: Payer: Self-pay | Admitting: Urgent Care

## 2011-05-30 DIAGNOSIS — K859 Acute pancreatitis without necrosis or infection, unspecified: Secondary | ICD-10-CM

## 2011-05-30 NOTE — Telephone Encounter (Signed)
Pt aware and meds reviewed pt will call with any questions or concerns

## 2011-05-30 NOTE — Telephone Encounter (Signed)
Message copied by Donata Duff on Tue May 30, 2011 10:16 AM ------      Message from: Rachael Fee      Created: Tue May 30, 2011  9:46 AM       Please set her up with upper EUS, 60 min, radial +/- linear, next available EUS day with propofol. Dx: abd pain, recent acute pancreatitis.            Thanks                  ----- Message -----         From: Chales Abrahams, CMA         Sent: 05/30/2011   9:30 AM           To: Rob Bunting, MD                        ----- Message -----         From: Sula Soda         Sent: 05/30/2011   9:25 AM           To: Chales Abrahams, CMA            Pt needs EUS to rule out a small occult lesion on her pancrease and to further evaluate for gallstone disease,            Thanks & have a great day                  Crystal

## 2011-05-30 NOTE — Telephone Encounter (Signed)
A user error has taken place: encounter opened in error, closed for administrative reasons.

## 2011-05-30 NOTE — Telephone Encounter (Signed)
Sent email request to Chales Abrahams regarding setting pt up for EUS w. Dr Christella Hartigan per RMR EGD note

## 2011-05-31 ENCOUNTER — Encounter (HOSPITAL_COMMUNITY): Payer: Self-pay | Admitting: Internal Medicine

## 2011-07-19 ENCOUNTER — Telehealth: Payer: Self-pay | Admitting: Gastroenterology

## 2011-07-19 NOTE — Telephone Encounter (Signed)
Pt as checking to make sure her appt was still scheduled for tomorrow she was advised she is scheduled and arrival time verified

## 2011-07-20 ENCOUNTER — Encounter (HOSPITAL_COMMUNITY): Payer: Self-pay | Admitting: Anesthesiology

## 2011-07-20 ENCOUNTER — Telehealth: Payer: Self-pay | Admitting: Gastroenterology

## 2011-07-20 ENCOUNTER — Encounter (HOSPITAL_COMMUNITY)
Admission: RE | Disposition: A | Discharge status: Home / Self Care | Payer: Self-pay | Source: Ambulatory Visit | Attending: Gastroenterology

## 2011-07-20 ENCOUNTER — Encounter (HOSPITAL_COMMUNITY): Payer: Self-pay

## 2011-07-20 ENCOUNTER — Ambulatory Visit (HOSPITAL_COMMUNITY)
Admission: RE | Admit: 2011-07-20 | Discharge: 2011-07-20 | Disposition: A | Discharge status: Home / Self Care | Payer: 59 | Source: Ambulatory Visit | Attending: Gastroenterology | Admitting: Gastroenterology

## 2011-07-20 DIAGNOSIS — E785 Hyperlipidemia, unspecified: Secondary | ICD-10-CM | POA: Insufficient documentation

## 2011-07-20 DIAGNOSIS — I10 Essential (primary) hypertension: Secondary | ICD-10-CM | POA: Insufficient documentation

## 2011-07-20 DIAGNOSIS — Z9119 Patient's noncompliance with other medical treatment and regimen: Secondary | ICD-10-CM | POA: Insufficient documentation

## 2011-07-20 DIAGNOSIS — Z538 Procedure and treatment not carried out for other reasons: Secondary | ICD-10-CM | POA: Insufficient documentation

## 2011-07-20 DIAGNOSIS — E669 Obesity, unspecified: Secondary | ICD-10-CM | POA: Insufficient documentation

## 2011-07-20 DIAGNOSIS — Z91199 Patient's noncompliance with other medical treatment and regimen due to unspecified reason: Secondary | ICD-10-CM | POA: Insufficient documentation

## 2011-07-20 DIAGNOSIS — R109 Unspecified abdominal pain: Secondary | ICD-10-CM | POA: Insufficient documentation

## 2011-07-20 DIAGNOSIS — J45909 Unspecified asthma, uncomplicated: Secondary | ICD-10-CM | POA: Insufficient documentation

## 2011-07-20 DIAGNOSIS — E039 Hypothyroidism, unspecified: Secondary | ICD-10-CM | POA: Insufficient documentation

## 2011-07-20 HISTORY — DX: Personal history of other diseases of the digestive system: Z87.19

## 2011-07-20 SURGERY — UPPER ENDOSCOPIC ULTRASOUND (EUS) RADIAL
Anesthesia: Monitor Anesthesia Care

## 2011-07-20 MED ORDER — SODIUM CHLORIDE 0.9 % IV SOLN: INTRAVENOUS | Status: DC

## 2011-07-20 NOTE — Progress Notes (Signed)
Patient noncompliant with home meds. Has not taken bp meds in 4 days. bp 154/115 Seen by Dr Rica Mast.  Procedure cancelled

## 2011-07-20 NOTE — H&P (Signed)
  HPI: This is a very pleasant woman  Abdominal pains, here for EUS for futher workup given negative testing to date    Review of systems: Pertinent positive and negative review of systems were noted in the above HPI section. Complete review of systems was performed and was otherwise normal.    Past Medical History  Diagnosis Date  . Hypertension   . Hyperlipidemia     stopped statins due to muscle pain  . Obesity   . Noncompliance   . Palpitations   . Hypothyroidism   . Acute pancreatitis 04/2011    ?secondary to HCTZ.   . Asthma   . H/O hiatal hernia     Past Surgical History  Procedure Date  . 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 Total abdominal hysterectomy 1991  . Tonsillectomy 1984  . Appendectomy 1979  . Colonoscopy 2011    Dr. Jeani Hawking. Per pt, colon polyps and due in five years for f/u.  Marland Kitchen Esophagogastroduodenoscopy 1990s    duodenitis per patient  . Esophagogastroduodenoscopy 05/24/2011    Procedure: ESOPHAGOGASTRODUODENOSCOPY (EGD);  Surgeon: Corbin Ade, MD;  Location: [facility];  Service: Endoscopy;  Laterality: N/A;  1:05    Current Facility-Administered Medications  Medication Dose Route Frequency Provider Last Rate Last Dose  . 0.9 %  sodium chloride infusion   Intravenous Continuous Rob Bunting, MD        Allergies as of 05/30/2011 - Review Complete 05/24/2011  Allergen Reaction Noted  . Codeine  05/18/2011    Family History  Problem Relation Age of Onset  . Heart attack Mother   . Hypertension Father   . Hyperlipidemia Father   . Hypertension Sister   . Colon cancer Neg Hx   . Liver disease Neg Hx   . Ulcerative colitis Mother     History   Social History  . Marital Status: Single    Spouse Name: N/A    Number of Children: 1  . Years of Education: N/A   Occupational History  . RN DIRECTOR  CLINICAL EDUCATION     [employer]   Social History Main  Topics  . Smoking status: Never Smoker   . Smokeless tobacco: Not on file  . Alcohol Use: No  . Drug Use: No  . Sexually Active: Not on file   Other Topics Concern  . Not on file   Social History Narrative   Lives in [place].Has 1 son (age 42).       Physical Exam: Constitutional: generally well-appearing Psychiatric: alert and oriented x3 Eyes: extraocular movements intact Mouth: oral pharynx moist, no lesions Neck: supple no lymphadenopathy Cardiovascular: heart regular rate and rhythm Lungs: clear to auscultation bilaterally Abdomen: soft, nontender, nondistended, no obvious ascites, no peritoneal signs, normal bowel sounds Extremities: no lower extremity edema bilaterally Skin: no lesions on visible extremities    Assessment and plan: 51 y.o. female with  Abdominal pain  Her BP was 220s/120s.  She has not taken her HTN meds in several days.  The anesthesiologist who was helping with sedation advised to cancel the case given her uncontrolled HTN and I agree.

## 2011-07-20 NOTE — Telephone Encounter (Signed)
No answer and no message machine

## 2011-07-20 NOTE — Telephone Encounter (Signed)
She has not been taking her BP meds in several days and her BP was 220's over 120's.  Dr. Rica Mast, who was helping with MAC sedation advised that we should cancel the case to address her BP and bring her back another time.  I agreed.   Patty, Can you get in touch with her.  She will need this to be rescheduled with propofol another time.  Re-educate her about her BP meds (she can take them all the way up to the morning of the procedure).  [place], we'll try again.  Probably will be 2-3 weeks from now.

## 2011-07-21 ENCOUNTER — Encounter (HOSPITAL_COMMUNITY): Payer: Self-pay | Admitting: Gastroenterology

## 2011-07-24 NOTE — Telephone Encounter (Signed)
Unable to reach pt

## 2011-07-25 NOTE — Telephone Encounter (Signed)
Unable to reach pt letter mailed 

## 2011-10-16 ENCOUNTER — Emergency Department: Payer: Self-pay | Admitting: Emergency Medicine

## 2011-10-17 LAB — COMPREHENSIVE METABOLIC PANEL
Albumin: 4.5 g/dL (ref 3.4–5.0)
Anion Gap: 10 (ref 7–16)
BUN: 9 mg/dL (ref 7–18)
Co2: 27 mmol/L (ref 21–32)
Creatinine: 0.86 mg/dL (ref 0.60–1.30)
EGFR (African American): >60
Glucose: 84 mg/dL (ref 65–99)
Osmolality: 283 (ref 275–301)
Potassium: 3.2 mmol/L — ABNORMAL LOW (ref 3.5–5.1)
SGOT(AST): 54 U/L — ABNORMAL HIGH (ref 15–37)
SGPT (ALT): 42 U/L
Sodium: 143 mmol/L (ref 136–145)

## 2011-10-17 LAB — CBC
HGB: 13.6 g/dL (ref 12.0–16.0)
MCHC: 34.2 g/dL (ref 32.0–36.0)
MCV: 93 fL (ref 80–100)
Platelet: 226 10*3/uL (ref 150–440)
RDW: 13.3 % (ref 11.5–14.5)
WBC: 7.7 10*3/uL (ref 3.6–11.0)

## 2011-10-17 LAB — URINALYSIS, COMPLETE
Glucose,UR: NEGATIVE mg/dL (ref 0–75)
Specific Gravity: 1.021 (ref 1.003–1.030)
WBC UR: 3 /HPF (ref 0–5)

## 2011-10-17 LAB — LIPASE, BLOOD: Lipase: 104 U/L (ref 73–393)

## 2012-01-12 ENCOUNTER — Telehealth: Payer: Self-pay

## 2012-01-12 MED ORDER — PANTOPRAZOLE SODIUM 40 MG PO TBEC: 40.0000 mg | DELAYED_RELEASE_TABLET | Freq: Two times a day (BID) | ORAL | Status: DC

## 2012-01-12 NOTE — Telephone Encounter (Signed)
Can RF until OV, but yes pt needs OV Please arrange Thanks

## 2012-01-12 NOTE — Telephone Encounter (Signed)
Informed pharmacist and [facility] for pt to schedule ov appt.

## 2012-01-12 NOTE — Telephone Encounter (Signed)
T/C from [place] at [facility] in [place], [place]. She was requesting a refill on Protonix 40 mg bid for pt. I asked her to fax over the request, she was very adamant that I take a message. When I checked the chart, I told her it looks like pt will need Ov prior to refills. Please advise!

## 2012-01-15 ENCOUNTER — Encounter: Payer: Self-pay | Admitting: Urgent Care

## 2012-01-15 NOTE — Telephone Encounter (Signed)
Mailed letter to patient to call office to set up OV to further refills °

## 2012-10-16 ENCOUNTER — Other Ambulatory Visit: Payer: Self-pay | Admitting: Internal Medicine

## 2012-12-10 ENCOUNTER — Emergency Department (HOSPITAL_COMMUNITY)
Admission: EM | Admit: 2012-12-10 | Discharge: 2012-12-10 | Disposition: A | Discharge status: Home / Self Care | Payer: Worker's Compensation | Attending: Emergency Medicine | Admitting: Emergency Medicine

## 2012-12-10 ENCOUNTER — Encounter (HOSPITAL_COMMUNITY): Payer: Self-pay | Admitting: Nurse Practitioner

## 2012-12-10 DIAGNOSIS — E669 Obesity, unspecified: Secondary | ICD-10-CM | POA: Insufficient documentation

## 2012-12-10 DIAGNOSIS — Z79899 Other long term (current) drug therapy: Secondary | ICD-10-CM | POA: Insufficient documentation

## 2012-12-10 DIAGNOSIS — Z8719 Personal history of other diseases of the digestive system: Secondary | ICD-10-CM | POA: Insufficient documentation

## 2012-12-10 DIAGNOSIS — Z862 Personal history of diseases of the blood and blood-forming organs and certain disorders involving the immune mechanism: Secondary | ICD-10-CM | POA: Insufficient documentation

## 2012-12-10 DIAGNOSIS — Z885 Allergy status to narcotic agent status: Secondary | ICD-10-CM | POA: Insufficient documentation

## 2012-12-10 DIAGNOSIS — J4521 Mild intermittent asthma with (acute) exacerbation: Secondary | ICD-10-CM

## 2012-12-10 DIAGNOSIS — E785 Hyperlipidemia, unspecified: Secondary | ICD-10-CM | POA: Insufficient documentation

## 2012-12-10 DIAGNOSIS — I10 Essential (primary) hypertension: Secondary | ICD-10-CM | POA: Insufficient documentation

## 2012-12-10 DIAGNOSIS — J45901 Unspecified asthma with (acute) exacerbation: Secondary | ICD-10-CM | POA: Insufficient documentation

## 2012-12-10 DIAGNOSIS — Z8639 Personal history of other endocrine, nutritional and metabolic disease: Secondary | ICD-10-CM | POA: Insufficient documentation

## 2012-12-10 MED ORDER — ALBUTEROL SULFATE HFA 108 (90 BASE) MCG/ACT IN AERS
2.0000 | INHALATION_SPRAY | Freq: Once | RESPIRATORY_TRACT | Status: AC
  Administered 2012-12-10: 2 via RESPIRATORY_TRACT
  Filled 2012-12-10 (×2): qty 6.7

## 2012-12-10 MED ORDER — ALBUTEROL SULFATE (5 MG/ML) 0.5% IN NEBU
5.0000 mg | INHALATION_SOLUTION | Freq: Once | RESPIRATORY_TRACT | Status: AC
  Administered 2012-12-10: 5 mg via RESPIRATORY_TRACT
  Filled 2012-12-10: qty 1

## 2012-12-10 NOTE — ED Notes (Signed)
Pt reports asthma attack onset 2 hours ago, using inhalers with no relief. Able to speak in short  Phrases on arrival to ed, mild labored noted, dry cough noted. States this started after she smelled strong cologne.

## 2012-12-10 NOTE — ED Provider Notes (Signed)
History     CSN: [id number]  Arrival date & time 12/10/12  1234   First MD Initiated Contact with Patient 12/10/12 1242      Chief Complaint  Patient presents with  . Asthma    (Consider location/radiation/quality/duration/timing/severity/associated sxs/prior treatment) HPI Comments: 52 year old female with a past medical history of asthma presents to the emergency department complaining of sudden onset dry hacking cough beginning 2 hours ago after smelling strong cologne while at work. States strong smells set off her asthma attacks, she went home to take her inhaler without any relief. Denies shortness of breath, wheezing, chest pain, fever, chills, lightheadedness, dizziness or any other symptoms. Prior to smelling the cologne she was feeling perfectly fine.   Patient is a 52 y.o. female presenting with asthma. The history is provided by the patient.  Asthma Associated symptoms include coughing. Pertinent negatives include no chest pain.    Past Medical History  Diagnosis Date  . Hypertension   . Hyperlipidemia     stopped statins due to muscle pain  . Obesity   . Noncompliance   . Palpitations   . Hypothyroidism   . Acute pancreatitis 04/2011    ?secondary to HCTZ.   . Asthma   . H/O hiatal hernia     Past Surgical History  Procedure Laterality Date  . 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 Total abdominal hysterectomy  1991  . Tonsillectomy  1984  . Appendectomy  1979  . Colonoscopy  2011    Dr. Jeani Hawking. Per pt, colon polyps and due in five years for f/u.  Marland Kitchen Esophagogastroduodenoscopy  1990s    duodenitis per patient  . Esophagogastroduodenoscopy  05/24/2011    Procedure: ESOPHAGOGASTRODUODENOSCOPY (EGD);  Surgeon: Corbin Ade, MD;  Location: [facility];  Service: Endoscopy;  Laterality: N/A;  1:05  . Eus  07/20/2011    Procedure: UPPER ENDOSCOPIC ULTRASOUND (EUS) RADIAL;  Surgeon: Rob Bunting, MD;   Location: [facility];  Service: Endoscopy;  Laterality: N/A;  radial and linear    Family History  Problem Relation Age of Onset  . Heart attack Mother   . Hypertension Father   . Hyperlipidemia Father   . Hypertension Sister   . Colon cancer Neg Hx   . Liver disease Neg Hx   . Ulcerative colitis Mother     History  Substance Use Topics  . Smoking status: Never Smoker   . Smokeless tobacco: Not on file  . Alcohol Use: No    OB History   Grav Para Term Preterm Abortions TAB SAB Ect Mult Living                  Review of Systems  Respiratory: Positive for cough. Negative for chest tightness, shortness of breath and wheezing.   Cardiovascular: Negative for chest pain.  Neurological: Negative for dizziness and light-headedness.  All other systems reviewed and are negative.    Allergies  Codeine  Home Medications   Current Outpatient Rx  Name  Route  Sig  Dispense  Refill  . hydrochlorothiazide (HYDRODIURIL) 25 MG tablet   Oral   Take 25 mg by mouth daily.         Marland Kitchen losartan (COZAAR) 50 MG tablet   Oral   Take 50 mg by mouth daily.          . pantoprazole (PROTONIX) 40 MG tablet   Oral   Take 1 tablet (40  mg total) by mouth 2 (two) times daily.   60 tablet   0   . VENTOLIN HFA 108 (90 BASE) MCG/ACT inhaler   Inhalation   Inhale 1 puff into the lungs every 4 (four) hours as needed. Shortness of breath           BP 144/89  Pulse 95  Temp(Src) 98.7 F (37.1 C) (Oral)  Resp 16  SpO2 99%  Physical Exam  Nursing note and vitals reviewed. Constitutional: She is oriented to person, place, and time. She appears well-developed and well-nourished. No distress.  HENT:  Head: Normocephalic and atraumatic.  Mouth/Throat: Oropharynx is clear and moist.  Post nasal drip noted.  Eyes: Conjunctivae are normal.  Neck: Normal range of motion. Neck supple. No tracheal deviation present.  Cardiovascular: Normal rate, regular rhythm, normal heart sounds and  intact distal pulses.   Pulmonary/Chest: Effort normal and breath sounds normal. No accessory muscle usage. No respiratory distress. She has no decreased breath sounds. She has no wheezes. She has no rhonchi. She has no rales.  Dry hacking cough present.  Musculoskeletal: Normal range of motion. She exhibits no edema.  Neurological: She is alert and oriented to person, place, and time.  Skin: Skin is warm and dry. She is not diaphoretic.  Psychiatric: She has a normal mood and affect. Her behavior is normal.    ED Course  Procedures (including critical care time)  Labs Reviewed - No data to display No results found.   1. Asthma exacerbation, mild intermittent       MDM  52 y/o female with asthma, physical exam unremarkable other than dry hacking cough. Lungs clear. She is in NAD, normal vital signs. No relief with inhaler. Will give albuterol breathing treatment and re-assess. 1:29 PM Patient reports improvement after neb treatment. States she has nebulizer at home, however is out of her inhaler and needs a new one. Lung sounds still unremarkable. Inhaler given, advised to f/u with PCP. Return precautions discussed. Patient states understanding of plan and is agreeable.   Trevor Mace, PA-C 12/10/12 1330

## 2012-12-10 NOTE — ED Provider Notes (Signed)
Medical screening examination/treatment/procedure(s) were performed by non-physician practitioner and as supervising physician I was immediately available for consultation/collaboration.    Marlina Cataldi R Ariyannah Pauling, MD 12/10/12 1600 

## 2012-12-10 NOTE — ED Notes (Signed)
Pt currently on breathing treatment. Will assess once it is completed.

## 2013-01-31 ENCOUNTER — Emergency Department: Payer: Self-pay | Admitting: Emergency Medicine

## 2013-01-31 LAB — COMPREHENSIVE METABOLIC PANEL
Anion Gap: 4 — ABNORMAL LOW (ref 7–16)
BUN: 9 mg/dL (ref 7–18)
Bilirubin,Total: 0.5 mg/dL (ref 0.2–1.0)
Co2: 29 mmol/L (ref 21–32)
EGFR (African American): >60
Osmolality: 280 (ref 275–301)
Potassium: 3.6 mmol/L (ref 3.5–5.1)
Sodium: 141 mmol/L (ref 136–145)
Total Protein: 8.3 g/dL — ABNORMAL HIGH (ref 6.4–8.2)

## 2013-01-31 LAB — URINALYSIS, COMPLETE
Ketone: NEGATIVE
Ph: 5 (ref 4.5–8.0)
RBC,UR: 1 /HPF (ref 0–5)
Squamous Epithelial: <1

## 2013-01-31 LAB — LIPASE, BLOOD: Lipase: 131 U/L (ref 73–393)

## 2013-01-31 LAB — CBC
MCH: 31.7 pg (ref 26.0–34.0)
MCHC: 35.1 g/dL (ref 32.0–36.0)
RBC: 4.41 10*6/uL (ref 3.80–5.20)

## 2013-04-21 ENCOUNTER — Ambulatory Visit: Payer: Self-pay

## 2013-05-12 ENCOUNTER — Ambulatory Visit: Payer: Self-pay | Admitting: Family Medicine

## 2013-07-10 HISTORY — PX: TRIGGER FINGER RELEASE: SHX641

## 2013-12-04 ENCOUNTER — Encounter: Payer: Self-pay | Admitting: Sports Medicine

## 2013-12-04 ENCOUNTER — Ambulatory Visit (INDEPENDENT_AMBULATORY_CARE_PROVIDER_SITE_OTHER): Payer: BC Managed Care – PPO | Admitting: Sports Medicine

## 2013-12-04 ENCOUNTER — Ambulatory Visit (INDEPENDENT_AMBULATORY_CARE_PROVIDER_SITE_OTHER): Payer: 59

## 2013-12-04 VITALS — BP 151/91 | HR 92

## 2013-12-04 DIAGNOSIS — M25579 Pain in unspecified ankle and joints of unspecified foot: Secondary | ICD-10-CM

## 2013-12-04 DIAGNOSIS — M79671 Pain in right foot: Secondary | ICD-10-CM | POA: Insufficient documentation

## 2013-12-04 DIAGNOSIS — M7730 Calcaneal spur, unspecified foot: Secondary | ICD-10-CM

## 2013-12-04 DIAGNOSIS — M79609 Pain in unspecified limb: Secondary | ICD-10-CM

## 2013-12-04 MED ORDER — MELOXICAM 15 MG PO TABS: ORAL_TABLET | ORAL | Status: DC

## 2013-12-04 MED ORDER — KETOROLAC TROMETHAMINE 30 MG/ML IJ SOLN
30.0000 mg | Freq: Once | INTRAMUSCULAR | Status: AC
  Administered 2013-12-04: 30 mg via INTRAMUSCULAR

## 2013-12-04 MED ORDER — HYDROCODONE-ACETAMINOPHEN 5-325 MG PO TABS: 1.0000 | ORAL_TABLET | Freq: Three times a day (TID) | ORAL | Status: DC | PRN

## 2013-12-04 MED ORDER — AMBULATORY NON FORMULARY MEDICATION: Status: DC

## 2013-12-04 NOTE — Progress Notes (Signed)
   Subjective:    I'm seeing this patient as a consultation for:  Dr. Bufford Spikes  CC: Right foot pain  HPI: This is a pleasant 53 year old female, for the past several days without trauma she's noted severe pain that she localizes medially at the navicular prominence. Pain is moderate, persistent, without radiation. Extremely difficult to bear weight.  Past medical history, Surgical history, Family history not pertinant except as noted below, Social history, Allergies, and medications have been entered into the medical record, reviewed, and no changes needed.   Review of Systems: No headache, visual changes, nausea, vomiting, diarrhea, constipation, dizziness, abdominal pain, skin rash, fevers, chills, night sweats, weight loss, swollen lymph nodes, body aches, joint swelling, muscle aches, chest pain, shortness of breath, mood changes, visual or auditory hallucinations.   Objective:   General: Well Developed, well nourished, and in no acute distress.  Neuro/Psych: Alert and oriented x3, extra-ocular muscles intact, able to move all 4 extremities, sensation grossly intact. Skin: Warm and dry, no rashes noted.  Respiratory: Not using accessory muscles, speaking in full sentences, trachea midline.  Cardiovascular: Pulses palpable, no extremity edema. Abdomen: Does not appear distended. Right Foot: No visible erythema or swelling. Range of motion is full in all directions. Strength is 5/5 in all directions. No hallux valgus. No pes cavus or pes planus. No abnormal callus noted. No pain over the navicular prominence, or base of fifth metatarsal. No tenderness to palpation of the calcaneal insertion of plantar fascia. No pain at the Achilles insertion. No pain over the calcaneal bursa. No pain of the retrocalcaneal bursa. Tender to palpation over the navicular prominence. Reproduction of pain with resisted inversion of the ankle. No hallux rigidus or limitus. No tenderness palpation  over interphalangeal joints. No pain with compression of the metatarsal heads. Neurovascularly intact distally.  X-rays show talonavicular degenerative changes.  Impression and Recommendations:   This case required medical decision making of moderate complexity.

## 2013-12-04 NOTE — Assessment & Plan Note (Signed)
Exquisitely tender to palpation over the navicular with reproduction of pain with resisted inversion. Rolling the walker, Toradol intramuscular, Mobic, Vicodin. Strap the leg with compressive dressing, x-rays. She has a [employer] at home which she will wear, she also has caused narcotics at home which she'll placed in the [employer]. Return to see me in 2 weeks.

## 2013-12-18 ENCOUNTER — Ambulatory Visit (INDEPENDENT_AMBULATORY_CARE_PROVIDER_SITE_OTHER): Payer: BC Managed Care – PPO | Admitting: Sports Medicine

## 2013-12-18 ENCOUNTER — Encounter: Payer: Self-pay | Admitting: Sports Medicine

## 2013-12-18 VITALS — BP 152/97 | HR 93 | Ht 63.0 in | Wt 248.0 lb

## 2013-12-18 DIAGNOSIS — M79609 Pain in unspecified limb: Secondary | ICD-10-CM

## 2013-12-18 DIAGNOSIS — M79671 Pain in right foot: Secondary | ICD-10-CM

## 2013-12-18 NOTE — Assessment & Plan Note (Signed)
Unfortunately she's only worn the boot intermittently. Pain has improved 80% however I still suspect navicular stress fracture. I like her to be in the boot 24 hours a day for the next 2 weeks, she can then return to see me in a 30 minute slot and I be happy to build her custom orthotics, she does have significant pes planus.

## 2013-12-18 NOTE — Progress Notes (Signed)
    Subjective:    CC: Recheck foot  HPI: Joan Salazar returns to see me, she had pain over the navicular prominence, moderate, persistent, I placed her in a cast boot, which she has only worn intermittently. She does report approximately 80% improvement in symptoms but still fairly tender over the navicular prominence itself. Pain is moderate, improving.  Past medical history, Surgical history, Family history not pertinant except as noted below, Social history, Allergies, and medications have been entered into the medical record, reviewed, and no changes needed.   Review of Systems: No fevers, chills, night sweats, weight loss, chest pain, or shortness of breath.   Objective:    General: Well Developed, well nourished, and in no acute distress.  Neuro: Alert and oriented x3, extra-ocular muscles intact, sensation grossly intact.  HEENT: Normocephalic, atraumatic, pupils equal round reactive to light, neck supple, no masses, no lymphadenopathy, thyroid nonpalpable.  Skin: Warm and dry, no rashes. Cardiac: Regular rate and rhythm, no murmurs rubs or gallops, no lower extremity edema. Respiratory: Clear to auscultation bilaterally. Not using accessory muscles, speaking in full sentences. Right Foot: No visible erythema or swelling. Range of motion is full in all directions. Strength is 5/5 in all directions. No hallux valgus. Pes planus. No abnormal callus noted. Still with relatively exquisite tenderness to palpation over the navicular prominence. No tenderness to palpation of the calcaneal insertion of plantar fascia. No pain at the Achilles insertion. No pain over the calcaneal bursa. No pain of the retrocalcaneal bursa. No tenderness to palpation over the tarsals, metatarsals, or phalanges. No hallux rigidus or limitus. No tenderness palpation over interphalangeal joints. No pain with compression of the metatarsal heads. Neurovascularly intact distally.  Impression and  Recommendations:

## 2014-01-05 ENCOUNTER — Encounter: Payer: Self-pay | Admitting: Sports Medicine

## 2014-01-05 ENCOUNTER — Ambulatory Visit (INDEPENDENT_AMBULATORY_CARE_PROVIDER_SITE_OTHER): Payer: BC Managed Care – PPO | Admitting: Sports Medicine

## 2014-01-05 VITALS — BP 143/95 | HR 89 | Ht 63.0 in | Wt 249.0 lb

## 2014-01-05 DIAGNOSIS — M79609 Pain in unspecified limb: Secondary | ICD-10-CM

## 2014-01-05 DIAGNOSIS — M79671 Pain in right foot: Secondary | ICD-10-CM

## 2014-01-05 NOTE — Assessment & Plan Note (Signed)
Orthotics as above. Return as needed. 

## 2014-01-05 NOTE — Progress Notes (Signed)
    Patient was fitted for a : standard, cushioned, semi-rigid orthotic. The orthotic was heated and afterward the patient stood on the orthotic blank positioned on the orthotic stand. The patient was positioned in subtalar neutral position and 10 degrees of ankle dorsiflexion in a weight bearing stance. After completion of molding, a stable base was applied to the orthotic blank. The blank was ground to a stable position for weight bearing. Size:7 [facility] Additional Posting and Padding: Right-sided large scaphoid The patient ambulated these, and they were very comfortable.  I spent 40 minutes with this patient, greater than 50% was face-to-face time counseling regarding the below diagnosis.

## 2014-07-23 ENCOUNTER — Encounter (HOSPITAL_COMMUNITY): Payer: Self-pay | Admitting: Gastroenterology

## 2014-08-03 ENCOUNTER — Emergency Department: Payer: Self-pay | Admitting: Emergency Medicine

## 2014-08-03 LAB — CBC WITH DIFFERENTIAL/PLATELET
BASOS ABS: 0 10*3/uL (ref 0.0–0.1)
Basophil %: 0.4 %
Eosinophil #: 0.2 10*3/uL (ref 0.0–0.7)
Eosinophil %: 2.3 %
HCT: 39.8 % (ref 35.0–47.0)
HGB: 13.2 g/dL (ref 12.0–16.0)
LYMPHS PCT: 22.1 %
Lymphocyte #: 2.4 10*3/uL (ref 1.0–3.6)
MCH: 30.7 pg (ref 26.0–34.0)
MCHC: 33.2 g/dL (ref 32.0–36.0)
MCV: 93 fL (ref 80–100)
MONOS PCT: 7.7 %
Monocyte #: 0.8 x10 3/mm (ref 0.2–0.9)
NEUTROS ABS: 7.3 10*3/uL — AB (ref 1.4–6.5)
NEUTROS PCT: 67.5 %
Platelet: 234 10*3/uL (ref 150–440)
RBC: 4.3 10*6/uL (ref 3.80–5.20)
RDW: 13.2 % (ref 11.5–14.5)
WBC: 10.8 10*3/uL (ref 3.6–11.0)

## 2014-08-03 LAB — COMPREHENSIVE METABOLIC PANEL
ANION GAP: 7 (ref 7–16)
Albumin: 3.9 g/dL (ref 3.4–5.0)
Alkaline Phosphatase: 72 U/L
BUN: 10 mg/dL (ref 7–18)
Bilirubin,Total: 0.4 mg/dL (ref 0.2–1.0)
CALCIUM: 9.2 mg/dL (ref 8.5–10.1)
CO2: 27 mmol/L (ref 21–32)
Chloride: 108 mmol/L — ABNORMAL HIGH (ref 98–107)
Creatinine: 1.07 mg/dL (ref 0.60–1.30)
EGFR (Non-African Amer.): 57 — ABNORMAL LOW
Glucose: 112 mg/dL — ABNORMAL HIGH (ref 65–99)
Osmolality: 283 (ref 275–301)
POTASSIUM: 3.3 mmol/L — AB (ref 3.5–5.1)
SGOT(AST): 31 U/L (ref 15–37)
SGPT (ALT): 34 U/L
SODIUM: 142 mmol/L (ref 136–145)
Total Protein: 7.7 g/dL (ref 6.4–8.2)

## 2014-08-03 LAB — LIPASE, BLOOD: Lipase: 168 U/L (ref 73–393)

## 2014-08-11 ENCOUNTER — Other Ambulatory Visit: Payer: Self-pay | Admitting: Family Medicine

## 2014-08-11 DIAGNOSIS — M542 Cervicalgia: Secondary | ICD-10-CM

## 2014-08-13 ENCOUNTER — Ambulatory Visit
Admission: RE | Admit: 2014-08-13 | Discharge: 2014-08-13 | Disposition: A | Discharge status: Home / Self Care | Payer: BLUE CROSS/BLUE SHIELD | Source: Ambulatory Visit | Attending: Family Medicine | Admitting: Family Medicine

## 2014-08-13 DIAGNOSIS — M542 Cervicalgia: Secondary | ICD-10-CM

## 2014-09-02 ENCOUNTER — Ambulatory Visit: Payer: Self-pay | Admitting: Sports Medicine

## 2014-10-15 ENCOUNTER — Ambulatory Visit (INDEPENDENT_AMBULATORY_CARE_PROVIDER_SITE_OTHER): Payer: BLUE CROSS/BLUE SHIELD

## 2014-10-15 ENCOUNTER — Encounter: Payer: Self-pay | Admitting: Sports Medicine

## 2014-10-15 ENCOUNTER — Ambulatory Visit (INDEPENDENT_AMBULATORY_CARE_PROVIDER_SITE_OTHER): Payer: BLUE CROSS/BLUE SHIELD | Admitting: Sports Medicine

## 2014-10-15 VITALS — BP 138/90 | HR 86 | Ht 63.0 in | Wt 245.0 lb

## 2014-10-15 DIAGNOSIS — Q741 Congenital malformation of knee: Secondary | ICD-10-CM

## 2014-10-15 DIAGNOSIS — M2242 Chondromalacia patellae, left knee: Secondary | ICD-10-CM

## 2014-10-15 DIAGNOSIS — M11262 Other chondrocalcinosis, left knee: Secondary | ICD-10-CM

## 2014-10-15 DIAGNOSIS — M2342 Loose body in knee, left knee: Secondary | ICD-10-CM | POA: Diagnosis not present

## 2014-10-15 DIAGNOSIS — M5030 Other cervical disc degeneration, unspecified cervical region: Secondary | ICD-10-CM

## 2014-10-15 MED ORDER — GABAPENTIN 300 MG PO CAPS: ORAL_CAPSULE | ORAL | Status: DC

## 2014-10-15 MED ORDER — MELOXICAM 15 MG PO TABS: ORAL_TABLET | ORAL | Status: DC

## 2014-10-15 NOTE — Assessment & Plan Note (Signed)
Ultrasound guided injection, meloxicam, formal physical therapy, x-rays.

## 2014-10-15 NOTE — Progress Notes (Signed)
   Subjective:    I'm seeing this patient as a consultation for:  Dr. Pecola Leisureeese  CC: left knee pain  HPI: This is a pleasant [age] year old female with left greater than right knee pain localized in the anterior medial joint line as well as under the kneecap, worse with squatting and rising from a seated position. Moderate, persistent without mechanical symptoms.  She also has neck pain with bilateral numbness and tingling in a C6 distribution, she did see her neurologist who recommended referral to orthopedics and surgery. She has not had physical therapy nor epidurals. Symptoms are moderate, persistent without any lower extremity weakness, bowel or bladder dysfunction or saddle numbness.  Past medical history, Surgical history, Family history not pertinant except as noted below, Social history, Allergies, and medications have been entered into the medical record, reviewed, and no changes needed.   Review of Systems: No headache, visual changes, nausea, vomiting, diarrhea, constipation, dizziness, abdominal pain, skin rash, fevers, chills, night sweats, weight loss, swollen lymph nodes, body aches, joint swelling, muscle aches, chest pain, shortness of breath, mood changes, visual or auditory hallucinations.   Objective:   General: Well Developed, well nourished, and in no acute distress.  Neuro/Psych: Alert and oriented x3, extra-ocular muscles intact, able to move all 4 extremities, sensation grossly intact. Skin: Warm and dry, no rashes noted.  Respiratory: Not using accessory muscles, speaking in full sentences, trachea midline.  Cardiovascular: Pulses palpable, no extremity edema. Abdomen: Does not appear distended. Left Knee: Normal to inspection with no erythema or effusion or obvious bony abnormalities. Tender to palpation along the patellar facets and the anteromedial joint line ROM normal in flexion and extension and lower leg rotation. Ligaments with solid consistent endpoints  including ACL, PCL, LCL, MCL. Negative Mcmurray's and provocative meniscal tests. Non painful patellar compression. Patellar and quadriceps tendons unremarkable. Hamstring and quadriceps strength is normal.  Procedure: Real-time Ultrasound Guided Injection of left knee Device: GE Logiq E  Verbal informed consent obtained.  Time-out conducted.  Noted no overlying erythema, induration, or other signs of local infection.  Skin prepped in a sterile fashion.  Local anesthesia: Topical Ethyl chloride.  With sterile technique and under real time ultrasound guidance:  2 mL kenalog 40, 4 mL lidocaine injected easily. Completed without difficulty  Pain immediately resolved suggesting accurate placement of the medication.  Advised to call if fevers/chills, erythema, induration, drainage, or persistent bleeding.  Images permanently stored and available for review in the ultrasound unit.  Impression: Technically successful ultrasound guided injection.  Impression and Recommendations:   This case required medical decision making of moderate complexity.

## 2014-10-15 NOTE — Patient Instructions (Signed)
Bilateral wrist spints for nocturnal use.

## 2014-10-15 NOTE — Assessment & Plan Note (Signed)
With bilateral C6 radiculopathy and no long tract signs. Neurology has recommended operative intervention. I do think we should proceed with physical therapy first, if fails we should proceed with an epidural. I'm going to add a bit of gabapentin for her to start out on. There is probably an element of bilateral carpal tunnel syndrome as well with a positive Tinel sign at the neurologist office but no nerve conduction study. In addition to the above physical therapy and medications we are going to add bilateral wrist extension splints to wear at night, she can purchase these over-the-counter. Before any further intervention on the neck or the wrists we would probably want a nerve conduction study to further delineate the principle point of nerve compression.

## 2014-10-22 ENCOUNTER — Ambulatory Visit: Payer: BLUE CROSS/BLUE SHIELD | Attending: Sports Medicine | Admitting: Physical Therapy

## 2014-10-22 DIAGNOSIS — M542 Cervicalgia: Secondary | ICD-10-CM | POA: Diagnosis not present

## 2014-10-22 DIAGNOSIS — R29898 Other symptoms and signs involving the musculoskeletal system: Secondary | ICD-10-CM | POA: Insufficient documentation

## 2014-10-22 NOTE — Therapy (Signed)
[facility] [place] [address]  [address] [place], [place], [address] Phone: [phone]   Fax:  [phone]  Physical Therapy Treatment  Patient Details  Name: Joan Salazar MRN: [id number] Date of Birth: 08/20/1960 Referring Provider:  Monica Bectonhekkekandam, Thomas J,*  Encounter Date: 10/22/2014      PT End of Session - 10/22/14 1526    Visit Number 1   Number of Visits 12   Date for PT Re-Evaluation 12/03/14   PT Start Time 1445   PT Stop Time 1537   PT Time Calculation (min) 52 min   Activity Tolerance Patient tolerated treatment well   Behavior During Therapy [facility] for tasks assessed/performed      Past Medical History  Diagnosis Date  . Hypertension   . Hyperlipidemia     stopped statins due to muscle pain  . Obesity   . Noncompliance   . Palpitations   . Hypothyroidism   . Acute pancreatitis 04/2011    ?secondary to HCTZ.   . Asthma   . H/O hiatal hernia     Past Surgical History  Procedure Laterality Date  . 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 Total abdominal hysterectomy  1991  . Tonsillectomy  1984  . Appendectomy  1979  . Colonoscopy  2011    Dr. Jeani HawkingPatrick Hung. Per pt, colon polyps and due in five years for f/u.  Marland Kitchen. Esophagogastroduodenoscopy  1990s    duodenitis per patient  . Esophagogastroduodenoscopy  05/24/2011    Procedure: ESOPHAGOGASTRODUODENOSCOPY (EGD);  Surgeon: Corbin Adeobert M Rourk, MD;  [facility];  Service: Endoscopy;  Laterality: N/A;  1:05    There were no vitals filed for this visit.  Visit Diagnosis:  Pain in neck - Plan: PT plan of care cert/re-cert  Upper extremity weakness - Plan: PT plan of care cert/re-cert      Subjective Assessment - 10/22/14 1448    Subjective Pt is a 54 y/o female who presents to OPPT for onset of neck pain in Jan 2016.  Pt reports pain has improved since Jan but has continued.  Pt also has referral for L  knee pain, but at this time reports recent injection and no limitations.  Pt requesting only focus on neck at this time.     Limitations Reading;House hold activities   Diagnostic tests Imaging revealed stenosis   Patient Stated Goals improve neck mobility   Currently in Pain? Yes   Pain Score 5    Pain Location Neck   Pain Orientation Posterior   Pain Descriptors / Indicators Tightness;Constant   Pain Radiating Towards tingling in fingers; reports LUE heaviness; radiated to mid thoracic   Pain Onset More than a month ago   Pain Frequency Constant   Aggravating Factors  lifting; lying down   Pain Relieving Factors nothing            [facility] PT Assessment - 10/22/14 1454    Assessment   Medical Diagnosis neck pain   Onset Date --  Jan 2016   Next MD Visit 1 month   Prior Therapy n/a   Precautions   Precautions None   Restrictions   Weight Bearing Restrictions No   Balance Screen   Has the patient fallen in the past 6 months No   Has the patient had a decrease in activity level because of a fear of falling?  No   Is the patient reluctant  to leave their home because of a fear of falling?  No   Prior Function   Level of Independence Independent with basic ADLs;Independent with gait;Independent with transfers   Vocation Full time employment   Freight forwarder, Risk analyst; orientation for new employees; job varies between sitting and standing   Leisure n/a   Cognition   Overall Cognitive Status Within Functional Limits for tasks assessed   Observation/Other Assessments   Focus on Therapeutic Outcomes (FOTO)  57 (43% limited; predicted 32% limited)   Posture/Postural Control   Posture/Postural Control Postural limitations   Postural Limitations Rounded Shoulders;Forward head;Increased thoracic kyphosis   AROM   Overall AROM Comments flexion WNL; extension and bil rotation limited 25%; bil sidebending WNL   Strength   Strength Assessment Site Shoulder;Elbow;Hand    Right Shoulder Flexion 4+/5   Right Shoulder ABduction 4/5   Right Shoulder Internal Rotation 5/5   Right Shoulder External Rotation 5/5   Left Shoulder Flexion 4/5   Left Shoulder ABduction 3+/5   Left Shoulder Internal Rotation 4/5   Left Shoulder External Rotation 4/5   Right/Left Elbow Right;Left   Right Elbow Flexion 4/5   Right Elbow Extension 4/5   Left Elbow Flexion 4/5   Left Elbow Extension 4/5   Grip (lbs) 47.33  R: 46, 46, 50   Grip (lbs) 46.33  L: 47, 47, 45   Palpation   Palpation significant tightness and tenderness to bil UT, middle trap, rhomboids and levator scapula.     Special Tests    Special Tests Cervical   Cervical Tests Spurling's;Dictraction   Spurling's   Findings Negative   Comment no change in symptoms bil; reports "pulling" with L side   Distraction Test   Findngs Negative   Comment no change in symptoms                   [facility] Adult PT Treatment/Exercise - 10/22/14 1454    Modalities   Modalities Electrical Stimulation;Moist Heat   Moist Heat Therapy   Number Minutes Moist Heat 15 Minutes   Moist Heat Location Other (comment)  neck   Electrical Stimulation   Electrical Stimulation Location Upper and middle trap   Electrical Stimulation Action IFC   Electrical Stimulation Parameters to tolerance x 15 min   Electrical Stimulation Goals Pain                PT Education - 10/22/14 1525    Education provided Yes   Education Details clinical findings and POC/goals of care   Person(s) Educated Patient   Methods Explanation   Comprehension Verbalized understanding             PT Long Term Goals - 10/22/14 1528    PT LONG TERM GOAL #1   Title independent with HEP (12/03/14)   Time 6   Period Weeks   Status New   PT LONG TERM GOAL #2   Title perform c-spine ROM without increase in pain (12/03/14)   Time 6   Period Weeks   Status New   PT LONG TERM GOAL #3   Title verbalize understanding of posture/body  mechanics to reduce risk of reinjury (12/03/14)   Time 6   Period Weeks   Status New   PT LONG TERM GOAL #4   Title report pain < 3/10 with activities for improved function (12/03/14)   Time 6   Period Weeks   Status New  Plan - 10/22/14 1526    Clinical Impression Statement Pt presents to OPPT with cervical spine pain likely multifactorial.  Will benefit from PT to improve pain and perform daily responsibilities with decreased pain.  Will plan to focus on postural reeducation and strengthening as well as pain relief.   Pt will benefit from skilled therapeutic intervention in order to improve on the following deficits Impaired flexibility;Improper body mechanics;Postural dysfunction;Pain;Impaired UE functional use;Decreased strength;Decreased activity tolerance;Decreased range of motion   Rehab Potential Good   PT Frequency 2x / week   PT Duration 6 weeks   PT Treatment/Interventions ADLs/Self Care Home Management;Cryotherapy;Electrical Stimulation;Moist Heat;Traction;Neuromuscular re-education;Ultrasound;Functional mobility training;Patient/family education;Passive range of motion;Manual techniques;Therapeutic exercise;Therapeutic activities   PT Next Visit Plan assess response to estim; trial traction; posture exercises for HEP   Consulted and Agree with Plan of Care Patient        Problem List Patient Active Problem List   Diagnosis Date Noted  . Chondromalacia of left patellofemoral joint 10/15/2014  . Degenerative disc disease, cervical 10/15/2014  . Right foot pain 12/04/2013  . Abdominal pain 05/16/2011  . Acute pancreatitis 05/04/2011    Class: Hospitalized for  . Abnormal LFTs 05/04/2011  . Diarrhea 05/04/2011  . HYPERLIPIDEMIA-MIXED 06/16/2009  . HYPERTENSION, MALIGNANT, UNCONTROLLED 06/16/2009  . FATIGUE / MALAISE 06/16/2009  . PALPITATIONS 06/16/2009   Clarita Crane, PT, DPT 10/22/2014 3:38 PM  [facility]  [facility] [address]  [address] [place], [place], [address] Phone: [phone]   Fax:  [phone]

## 2014-11-10 ENCOUNTER — Ambulatory Visit: Payer: BLUE CROSS/BLUE SHIELD | Attending: Sports Medicine | Admitting: Rehabilitation

## 2014-11-10 DIAGNOSIS — R29898 Other symptoms and signs involving the musculoskeletal system: Secondary | ICD-10-CM | POA: Diagnosis not present

## 2014-11-10 DIAGNOSIS — M542 Cervicalgia: Secondary | ICD-10-CM | POA: Diagnosis not present

## 2014-11-10 NOTE — Therapy (Signed)
[facility] [place] [address]  [address] [place], [place], [address] Phone: [phone]   Fax:  [phone]  Physical Therapy Treatment  Patient Details  Name: Joan Salazar MRN: [id number] Date of Birth: [birth year] Referring Provider:  Monica Bectonhekkekandam, Thomas J,*  Encounter Date: 11/10/2014      PT End of Session - 11/10/14 1614    Visit Number 2   Number of Visits 12   Date for PT Re-Evaluation 12/03/14   PT Start Time 1614   PT Stop Time 1707   PT Time Calculation (min) 53 min      Past Medical History  Diagnosis Date  . Hypertension   . Hyperlipidemia     stopped statins due to muscle pain  . Obesity   . Noncompliance   . Palpitations   . Hypothyroidism   . Acute pancreatitis 04/2011    ?secondary to HCTZ.   . Asthma   . H/O hiatal hernia     Past Surgical History  Procedure Laterality Date  . 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 Total abdominal hysterectomy  1991  . Tonsillectomy  1984  . Appendectomy  1979  . Colonoscopy  2011    Dr. Jeani HawkingPatrick Hung. Per pt, colon polyps and due in five years for f/u.  Marland Kitchen. Esophagogastroduodenoscopy  1990s    duodenitis per patient  . Esophagogastroduodenoscopy  05/24/2011    Procedure: ESOPHAGOGASTRODUODENOSCOPY (EGD);  Surgeon: Corbin Adeobert M Rourk, MD;  [facility];  Service: Endoscopy;  Laterality: N/A;  1:05    There were no vitals filed for this visit.  Visit Diagnosis:  Pain in neck  Upper extremity weakness      Subjective Assessment - 11/10/14 1621    Subjective "Having a good day." Can reach a 7/10 at times, unsure of the cause.    Currently in Pain? Yes   Pain Score 5    Pain Location Neck                         [facility] Adult PT Treatment/Exercise - 11/10/14 1614    Exercises   Exercises Neck;Shoulder   Shoulder Exercises: Standing   Extension Both;10 reps;Theraband   Theraband Level  (Shoulder Extension) Level 3 (Green)   Row Both;10 reps;Theraband   Theraband Level (Shoulder Row) Level 3 (Green)   Shoulder Exercises: ROM/Strengthening   UBE (Upper Arm Bike) level 1.0 2 min forward/2 min backward   Modalities   Modalities Traction   Traction   Type of Traction Cervical   Min (lbs) 3   Max (lbs) 8   Hold Time 60   Rest Time 20   Time 10min   Manual Therapy   Manual Therapy Massage   Massage To bilateral UT, LS, upper rhomboids, cervical paraspinals   Neck Exercises: Stretches   Upper Trapezius Stretch 2 reps;20 seconds  bilateral   Levator Stretch 2 reps;20 seconds  bilateral   Corner Stretch 2 reps;20 seconds  In doorway   Other Neck Stretches Seated Childs pose with pball 2x20" M/L/R                PT Education - 11/10/14 1700    Education provided Yes   Education Details HEP   Person(s) Educated Patient   Methods Explanation;Demonstration;Handout   Comprehension Verbalized understanding;Returned demonstration             PT Long  Term Goals - 10/22/14 1528    PT LONG TERM GOAL #1   Title independent with HEP (12/03/14)   Time 6   Period Weeks   Status New   PT LONG TERM GOAL #2   Title perform c-spine ROM without increase in pain (12/03/14)   Time 6   Period Weeks   Status New   PT LONG TERM GOAL #3   Title verbalize understanding of posture/body mechanics to reduce risk of reinjury (12/03/14)   Time 6   Period Weeks   Status New   PT LONG TERM GOAL #4   Title report pain < 3/10 with activities for improved function (12/03/14)   Time 6   Period Weeks   Status New               Plan - 11/10/14 1656    Clinical Impression Statement Very low tolerance to exercises and manual work. Had a Lt upper trap muscle spasms during manual work.    PT Next Visit Plan Assess how traction went.    Consulted and Agree with Plan of Care Patient        Problem List Patient Active Problem List   Diagnosis Date Noted  .  Chondromalacia of left patellofemoral joint 10/15/2014  . Degenerative disc disease, cervical 10/15/2014  . Right foot pain 12/04/2013  . Abdominal pain 05/16/2011  . Acute pancreatitis 05/04/2011    Class: Hospitalized for  . Abnormal LFTs 05/04/2011  . Diarrhea 05/04/2011  . HYPERLIPIDEMIA-MIXED 06/16/2009  . HYPERTENSION, MALIGNANT, UNCONTROLLED 06/16/2009  . FATIGUE / MALAISE 06/16/2009  . PALPITATIONS 06/16/2009    Ronney Lion, PTA 11/10/2014, 5:00 PM  [facility] [address]  [address] [place], [place], [address] Phone: [phone]   Fax:  [phone]

## 2014-11-12 ENCOUNTER — Ambulatory Visit: Payer: Self-pay | Admitting: Sports Medicine

## 2014-11-17 ENCOUNTER — Ambulatory Visit: Payer: BLUE CROSS/BLUE SHIELD

## 2014-11-19 ENCOUNTER — Ambulatory Visit: Payer: BLUE CROSS/BLUE SHIELD | Admitting: Rehabilitation

## 2014-11-19 DIAGNOSIS — R29898 Other symptoms and signs involving the musculoskeletal system: Secondary | ICD-10-CM

## 2014-11-19 DIAGNOSIS — M542 Cervicalgia: Secondary | ICD-10-CM

## 2014-11-19 NOTE — Therapy (Signed)
[facility] [place] [address]  [address] [place], [place], [address] Phone: [phone]   Fax:  [phone]  Physical Therapy Treatment  Patient Details  Name: Joan FavaDiane Kopper MRN: [id number] Date of Birth: 1960-08-03 Referring Provider:  Monica Bectonhekkekandam, Thomas J,*  Encounter Date: 11/19/2014      PT End of Session - 11/19/14 1609    Visit Number 3   Number of Visits 12   Date for PT Re-Evaluation 12/03/14   PT Start Time 1610   PT Stop Time 1654   PT Time Calculation (min) 44 min      Past Medical History  Diagnosis Date  . Hypertension   . Hyperlipidemia     stopped statins due to muscle pain  . Obesity   . Noncompliance   . Palpitations   . Hypothyroidism   . Acute pancreatitis 04/2011    ?secondary to HCTZ.   . Asthma   . H/O hiatal hernia     Past Surgical History  Procedure Laterality Date  . 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 Total abdominal hysterectomy  1991  . Tonsillectomy  1984  . Appendectomy  1979  . Colonoscopy  2011    Dr. Jeani HawkingPatrick Hung. Per pt, colon polyps and due in five years for f/u.  Marland Kitchen. Esophagogastroduodenoscopy  1990s    duodenitis per patient  . Esophagogastroduodenoscopy  05/24/2011    Procedure: ESOPHAGOGASTRODUODENOSCOPY (EGD);  Surgeon: Corbin Adeobert M Rourk, MD;  [facility];  Service: Endoscopy;  Laterality: N/A;  1:05    There were no vitals filed for this visit.  Visit Diagnosis:  Pain in neck  Upper extremity weakness      Subjective Assessment - 11/19/14 1611    Subjective "Doing good today." Reports some pain but not enough to complain about. Pain has reached a 4/10 at worst in the past week. States she hurt during the treatment last time but felt like traction helped decrease the pain. Reports she had a massage earlier this week, can't tell if it helped or not. Has noticed a lot of popping in her neck for unknown reason.     Currently in Pain? No/denies                         [facility]/Exercise - 11/19/14 1611    Neck Exercises: Supine   Neck Retraction 10 reps;3 secs   Upper Extremity D2 Flexion;10 reps;Theraband  bilateral   Theraband Level (UE D2) Level 2 (Red)   Neck Exercises: Sidelying   Other Sidelying Exercise Open Book/horiz abd stretch 3"x10   Neck Exercises: Prone   Other Prone Exercise POE diagonals x10 each side   Shoulder Exercises: Supine   Horizontal ABduction Both;10 reps;Theraband   Theraband Level (Shoulder Horizontal ABduction) Level 2 (Red)   External Rotation Both;10 reps;Theraband  bilateral ER   Theraband Level (Shoulder External Rotation) Level 2 (Red)   Flexion Both;10 reps  3# with 3" hold, bilateral pullover   Shoulder Exercises: ROM/Strengthening   UBE (Upper Arm Bike) level 1.0 2 min forward/2 min backward  reports backwards is worse than forwards   Modalities   Modalities Traction   Traction   Type of Traction Cervical   Min (lbs) 5   Max (lbs) 10   Hold Time 60    Rest Time 20   Time 10min   Neck Exercises: Stretches  Corner Stretch 2 reps;20 seconds  in doorway                     PT Long Term Goals - 11/19/14 1626    PT LONG TERM GOAL #1   Title independent with HEP (12/03/14)   Status On-going   PT LONG TERM GOAL #2   Title perform c-spine ROM without increase in pain (12/03/14)   Status On-going   PT LONG TERM GOAL #3   Title verbalize understanding of posture/body mechanics to reduce risk of reinjury (12/03/14)   Status On-going   PT LONG TERM GOAL #4   Title report pain < 3/10 with activities for improved function (12/03/14)   Status On-going               Plan - 11/19/14 1645    Clinical Impression Statement Better tolerance to exercises today, did not attempt manual work again due to pt getting a massage earlier this week. Will continue to gently add exercises.    PT Next Visit Plan  Continue to add exercises slowly, try cervical stretches again at next visit.          Problem List Patient Active Problem List   Diagnosis Date Noted  . Chondromalacia of left patellofemoral joint 10/15/2014  . Degenerative disc disease, cervical 10/15/2014  . Right foot pain 12/04/2013  . Abdominal pain 05/16/2011  . Acute pancreatitis 05/04/2011    Class: Hospitalized for  . Abnormal LFTs 05/04/2011  . Diarrhea 05/04/2011  . HYPERLIPIDEMIA-MIXED 06/16/2009  . HYPERTENSION, MALIGNANT, UNCONTROLLED 06/16/2009  . FATIGUE / MALAISE 06/16/2009  . PALPITATIONS 06/16/2009    Ronney LionDUCKER, Sani Madariaga J, PTA 11/19/2014, 4:49 PM  [facility] [place] [address]  [address] [place], [place], [address] Phone: [phone]   Fax:  [phone]

## 2014-11-24 ENCOUNTER — Ambulatory Visit: Payer: BLUE CROSS/BLUE SHIELD | Admitting: Physical Therapy

## 2014-11-24 DIAGNOSIS — M542 Cervicalgia: Secondary | ICD-10-CM

## 2014-11-24 DIAGNOSIS — R29898 Other symptoms and signs involving the musculoskeletal system: Secondary | ICD-10-CM

## 2014-11-24 NOTE — Therapy (Signed)
[facility] [place] [address]  [address] [place], [place], [address] Phone: [phone]   Fax:  [phone]  Physical Therapy Treatment  Patient Details  Name: Joan FavaDiane Salazar MRN: [id number] Date of Birth: 1961/04/03 Referring Provider:  Monica Bectonhekkekandam, Thomas J,*  Encounter Date: 11/24/2014      PT End of Session - 11/24/14 1650    Visit Number 4   Number of Visits 12   Date for PT Re-Evaluation 12/03/14   PT Start Time 1615   PT Stop Time 1703   PT Time Calculation (min) 48 min   Activity Tolerance Patient tolerated treatment well   Behavior During Therapy [facility] for tasks assessed/performed      Past Medical History  Diagnosis Date  . Hypertension   . Hyperlipidemia     stopped statins due to muscle pain  . Obesity   . Noncompliance   . Palpitations   . Hypothyroidism   . Acute pancreatitis 04/2011    ?secondary to HCTZ.   . Asthma   . H/O hiatal hernia     Past Surgical History  Procedure Laterality Date  . 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 Total abdominal hysterectomy  1991  . Tonsillectomy  1984  . Appendectomy  1979  . Colonoscopy  2011    Dr. Jeani HawkingPatrick Hung. Per pt, colon polyps and due in five years for f/u.  Marland Kitchen. Esophagogastroduodenoscopy  1990s    duodenitis per patient  . Esophagogastroduodenoscopy  05/24/2011    Procedure: ESOPHAGOGASTRODUODENOSCOPY (EGD);  Surgeon: Corbin Adeobert M Rourk, MD;  [facility];  Service: Endoscopy;  Laterality: N/A;  1:05    There were no vitals filed for this visit.  Visit Diagnosis:  Pain in neck  Upper extremity weakness      Subjective Assessment - 11/24/14 1618    Subjective "okay."  No change in symptoms but does report no "really bad episodes."   Limitations Reading;House hold activities   Diagnostic tests Imaging revealed stenosis   Patient Stated Goals improve neck mobility   Currently in Pain? No/denies                          [facility] Treatment/Exercise - 11/24/14 1619    Neck Exercises: Supine   Neck Retraction 10 reps;3 secs   Neck Retraction Limitations on towel roll   Shoulder Flexion 10 reps;Both   Shoulder Flexion Limitations yellow theraband with towel roll   Shoulder ABduction 10 reps;Both   Shoulder Abduction Limitations horizontal abdct with yellow tband on towel roll   Other Supine Exercise supine on towel roll x 3 min   Neck Exercises: Sidelying   Other Sidelying Exercise Open Book/horiz abd stretch 3"x10   Shoulder Exercises: Standing   Retraction Strengthening;Both;15 reps;Theraband   Theraband Level (Shoulder Retraction) Level 3 (Green)   Retraction Limitations 2 sets   Shoulder Exercises: ROM/Strengthening   UBE (Upper Arm Bike) level 2.0 2 min forward/2 min backward   Traction   Type of Traction Cervical   Min (lbs) 10   Max (lbs) 18   Hold Time 60    Rest Time 20   Time 12                     PT Long Term Goals - 11/19/14 1626    PT LONG TERM GOAL #1   Title independent with  HEP (12/03/14)   Status On-going   PT LONG TERM GOAL #2   Title perform c-spine ROM without increase in pain (12/03/14)   Status On-going   PT LONG TERM GOAL #3   Title verbalize understanding of posture/body mechanics to reduce risk of reinjury (12/03/14)   Status On-going   PT LONG TERM GOAL #4   Title report pain < 3/10 with activities for improved function (12/03/14)   Status On-going               Plan - 11/24/14 1650    Clinical Impression Statement Pt tolerated exercises well today without increase in symptoms.  Will continue to benefit from PT to improve pain and function.   PT Next Visit Plan Continue to add exercises slowly, try cervical stretches again at next visit.     Consulted and Agree with Plan of Care Patient        Problem List Patient Active Problem List   Diagnosis Date Noted  . Chondromalacia of left  patellofemoral joint 10/15/2014  . Degenerative disc disease, cervical 10/15/2014  . Right foot pain 12/04/2013  . Abdominal pain 05/16/2011  . Acute pancreatitis 05/04/2011    Class: Hospitalized for  . Abnormal LFTs 05/04/2011  . Diarrhea 05/04/2011  . HYPERLIPIDEMIA-MIXED 06/16/2009  . HYPERTENSION, MALIGNANT, UNCONTROLLED 06/16/2009  . FATIGUE / MALAISE 06/16/2009  . PALPITATIONS 06/16/2009   Clarita CraneStephanie F Karol Skarzynski, PT, DPT 11/24/2014 5:03 PM  [facility] [place] [address]  [address] [place], [place], [address] Phone: [phone]   Fax:  [phone]

## 2014-11-26 ENCOUNTER — Ambulatory Visit: Payer: BLUE CROSS/BLUE SHIELD | Admitting: Rehabilitation

## 2014-11-26 DIAGNOSIS — R29898 Other symptoms and signs involving the musculoskeletal system: Secondary | ICD-10-CM

## 2014-11-26 DIAGNOSIS — M542 Cervicalgia: Secondary | ICD-10-CM | POA: Diagnosis not present

## 2014-11-26 NOTE — Therapy (Addendum)
[facility] [address]  [place], [place], [address] Phone: [phone]   Fax:  [phone]  Physical Therapy Treatment  Patient Details  Name: Joan Salazar MRN: [id number] Date of Birth: 1961-03-31 Referring Provider:  Silverio Decamp,*  Encounter Date: 11/26/2014      PT End of Session - 11/26/14 1618    Visit Number 5   Number of Visits 12   Date for PT Re-Evaluation 12/03/14   PT Start Time 7939   PT Stop Time 1707   PT Time Calculation (min) 51 min      Past Medical History  Diagnosis Date  . Hypertension   . Hyperlipidemia     stopped statins due to muscle pain  . Obesity   . Noncompliance   . Palpitations   . Hypothyroidism   . Acute pancreatitis 04/2011    ?secondary to HCTZ.   . Asthma   . H/O hiatal hernia     Past Surgical History  Procedure Laterality Date  . 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 Total abdominal hysterectomy  1991  . Tonsillectomy  1984  . Appendectomy  1979  . Colonoscopy  2011    Dr. Carol Ada. Per pt, colon polyps and due in five years for f/u.  Marland Kitchen Esophagogastroduodenoscopy  1990s    duodenitis per patient  . Esophagogastroduodenoscopy  05/24/2011    Procedure: ESOPHAGOGASTRODUODENOSCOPY (EGD);  Surgeon: Daneil Dolin, MD;  Location: [facility];  Service: Endoscopy;  Laterality: N/A;  1:05    There were no vitals filed for this visit.  Visit Diagnosis:  Pain in neck  Upper extremity weakness      Subjective Assessment - 11/26/14 1617    Subjective Reports no pain today, felt ok after last time. Still no "bad episodes."   Currently in Pain? No/denies                         [facility] Adult PT Treatment/Exercise - 11/26/14 1620    Neck Exercises: Supine   Neck Retraction 10 reps;3 secs   Neck Retraction Limitations on 1/2 foam roll   Shoulder Flexion 10 reps;Both   Shoulder  Flexion Weights (lbs) 2   Shoulder Flexion Limitations bilateral pullover   Shoulder ABduction 10 reps;Both   Shoulder Abduction Limitations horizontal abduction with red TB on 1/2 foam roll   Other Supine Exercise supine on 1/2 foam roll x 3 min   Neck Exercises: Sidelying   Other Sidelying Exercise Open Book/horiz abd stretch 3"x10   Shoulder Exercises: ROM/Strengthening   UBE (Upper Arm Bike) level 2.0 2 min forward/2 min backward   Traction   Type of Traction Cervical   Min (lbs) 10   Max (lbs) 18   Hold Time 60   Rest Time 20   Time 15   Neck Exercises: Stretches   Upper Trapezius Stretch 2 reps;20 seconds  bilateral   Levator Stretch 2 reps;20 seconds  bilateral   Other Neck Stretches Seated Rhomboid stretch with pball 2x20"                     PT Long Term Goals - 11/19/14 1626    PT LONG TERM GOAL #1   Title independent with HEP (12/03/14)   Status On-going   PT LONG TERM GOAL #2   Title perform c-spine ROM without increase in  pain (12/03/14)   Status On-going   PT LONG TERM GOAL #3   Title verbalize understanding of posture/body mechanics to reduce risk of reinjury (12/03/14)   Status On-going   PT LONG TERM GOAL #4   Title report pain < 3/10 with activities for improved function (12/03/14)   Status On-going               Plan - 11/26/14 1650    Clinical Impression Statement Good tolerance to today's exercises with no compaint of pain. Kept traction the same but increased the time.    PT Next Visit Plan Continue to add exercises slowly, see how pt did with longer traction time.    Consulted and Agree with Plan of Care Patient        Problem List Patient Active Problem List   Diagnosis Date Noted  . Chondromalacia of left patellofemoral joint 10/15/2014  . Degenerative disc disease, cervical 10/15/2014  . Right foot pain 12/04/2013  . Abdominal pain 05/16/2011  . Acute pancreatitis 05/04/2011    Class: Hospitalized for  . Abnormal  LFTs 05/04/2011  . Diarrhea 05/04/2011  . HYPERLIPIDEMIA-MIXED 06/16/2009  . HYPERTENSION, MALIGNANT, UNCONTROLLED 06/16/2009  . FATIGUE / MALAISE 06/16/2009  . PALPITATIONS 06/16/2009    Barbette Hair, PTA 11/26/2014, 4:51 PM  [facility] [address]  [place], [place], [address] Phone: [phone]   Fax:  [phone]     PHYSICAL THERAPY DISCHARGE SUMMARY  Visits from Start of Care: 5  Current functional level related to goals / functional outcomes: See above; pt did not return to PT   Remaining deficits: Unknown as pt did not return   Education / Equipment: HEP  Plan: Patient agrees to discharge.  Patient goals were not met. Patient is being discharged due to not returning since the last visit.  ?????   Laureen Abrahams, PT, DPT 01/07/2015 4:34 PM  [facility] [place], [place] [address]  [phone] (office) [phone] (fax)

## 2015-03-02 ENCOUNTER — Other Ambulatory Visit: Payer: Self-pay | Admitting: Family Medicine

## 2015-03-02 DIAGNOSIS — Z1231 Encounter for screening mammogram for malignant neoplasm of breast: Secondary | ICD-10-CM

## 2015-03-04 ENCOUNTER — Ambulatory Visit
Admission: RE | Admit: 2015-03-04 | Discharge: 2015-03-04 | Disposition: A | Discharge status: Home / Self Care | Payer: BLUE CROSS/BLUE SHIELD | Source: Ambulatory Visit | Attending: Family Medicine | Admitting: Family Medicine

## 2015-03-04 DIAGNOSIS — Z1231 Encounter for screening mammogram for malignant neoplasm of breast: Secondary | ICD-10-CM | POA: Insufficient documentation

## 2015-05-17 ENCOUNTER — Other Ambulatory Visit (HOSPITAL_COMMUNITY): Payer: Self-pay | Admitting: Neurological Surgery

## 2015-05-25 ENCOUNTER — Ambulatory Visit (HOSPITAL_COMMUNITY)
Admission: RE | Admit: 2015-05-25 | Discharge: 2015-05-25 | Disposition: A | Discharge status: Home / Self Care | Payer: BLUE CROSS/BLUE SHIELD | Source: Ambulatory Visit | Attending: Neurological Surgery | Admitting: Neurological Surgery

## 2015-05-25 ENCOUNTER — Encounter (HOSPITAL_COMMUNITY): Payer: Self-pay

## 2015-05-25 ENCOUNTER — Encounter (HOSPITAL_COMMUNITY)
Admission: RE | Admit: 2015-05-25 | Discharge: 2015-05-25 | Disposition: A | Discharge status: Home / Self Care | Payer: BLUE CROSS/BLUE SHIELD | Source: Ambulatory Visit | Attending: Neurological Surgery | Admitting: Neurological Surgery

## 2015-05-25 DIAGNOSIS — I10 Essential (primary) hypertension: Secondary | ICD-10-CM | POA: Insufficient documentation

## 2015-05-25 DIAGNOSIS — Z01818 Encounter for other preprocedural examination: Secondary | ICD-10-CM | POA: Insufficient documentation

## 2015-05-25 DIAGNOSIS — Z01812 Encounter for preprocedural laboratory examination: Secondary | ICD-10-CM | POA: Insufficient documentation

## 2015-05-25 DIAGNOSIS — E785 Hyperlipidemia, unspecified: Secondary | ICD-10-CM | POA: Insufficient documentation

## 2015-05-25 DIAGNOSIS — K219 Gastro-esophageal reflux disease without esophagitis: Secondary | ICD-10-CM | POA: Diagnosis not present

## 2015-05-25 DIAGNOSIS — M1990 Unspecified osteoarthritis, unspecified site: Secondary | ICD-10-CM | POA: Insufficient documentation

## 2015-05-25 DIAGNOSIS — M4802 Spinal stenosis, cervical region: Secondary | ICD-10-CM

## 2015-05-25 DIAGNOSIS — K449 Diaphragmatic hernia without obstruction or gangrene: Secondary | ICD-10-CM | POA: Diagnosis not present

## 2015-05-25 DIAGNOSIS — R9431 Abnormal electrocardiogram [ECG] [EKG]: Secondary | ICD-10-CM | POA: Diagnosis not present

## 2015-05-25 DIAGNOSIS — J45909 Unspecified asthma, uncomplicated: Secondary | ICD-10-CM | POA: Diagnosis not present

## 2015-05-25 DIAGNOSIS — Z01810 Encounter for preprocedural cardiovascular examination: Secondary | ICD-10-CM | POA: Diagnosis not present

## 2015-05-25 HISTORY — DX: Other specified postprocedural states: Z98.890

## 2015-05-25 HISTORY — DX: Other complications of anesthesia, initial encounter: T88.59XA

## 2015-05-25 HISTORY — DX: Paresthesia of skin: R20.2

## 2015-05-25 HISTORY — DX: Unspecified osteoarthritis, unspecified site: M19.90

## 2015-05-25 HISTORY — DX: Other specified postprocedural states: R11.2

## 2015-05-25 HISTORY — DX: Headache, unspecified: R51.9

## 2015-05-25 HISTORY — DX: Headache: R51

## 2015-05-25 HISTORY — DX: Gastro-esophageal reflux disease without esophagitis: K21.9

## 2015-05-25 HISTORY — DX: Personal history of other mental and behavioral disorders: Z86.59

## 2015-05-25 HISTORY — DX: Adverse effect of unspecified anesthetic, initial encounter: T41.45XA

## 2015-05-25 HISTORY — DX: Personal history of pneumonia (recurrent): Z87.01

## 2015-05-25 HISTORY — DX: Anesthesia of skin: R20.0

## 2015-05-25 LAB — CBC WITH DIFFERENTIAL/PLATELET
BASOS ABS: 0 10*3/uL (ref 0.0–0.1)
Basophils Relative: 0 %
EOS PCT: 1 %
Eosinophils Absolute: 0.1 10*3/uL (ref 0.0–0.7)
HCT: 41.6 % (ref 36.0–46.0)
HEMOGLOBIN: 14 g/dL (ref 12.0–15.0)
LYMPHS PCT: 28 %
Lymphs Abs: 2.9 10*3/uL (ref 0.7–4.0)
MCH: 31 pg (ref 26.0–34.0)
MCHC: 33.7 g/dL (ref 30.0–36.0)
MCV: 92.2 fL (ref 78.0–100.0)
Monocytes Absolute: 0.5 10*3/uL (ref 0.1–1.0)
Monocytes Relative: 5 %
NEUTROS PCT: 66 %
Neutro Abs: 6.8 10*3/uL (ref 1.7–7.7)
PLATELETS: 243 10*3/uL (ref 150–400)
RBC: 4.51 MIL/uL (ref 3.87–5.11)
RDW: 13.1 % (ref 11.5–15.5)
WBC: 10.4 10*3/uL (ref 4.0–10.5)

## 2015-05-25 LAB — TYPE AND SCREEN
ABO/RH(D): A POS
ANTIBODY SCREEN: NEGATIVE

## 2015-05-25 LAB — ABO/RH: ABO/RH(D): A POS

## 2015-05-25 LAB — BASIC METABOLIC PANEL
ANION GAP: 8 (ref 5–15)
BUN: 11 mg/dL (ref 6–20)
CO2: 28 mmol/L (ref 22–32)
Calcium: 10.2 mg/dL (ref 8.9–10.3)
Chloride: 106 mmol/L (ref 101–111)
Creatinine, Ser: 0.89 mg/dL (ref 0.44–1.00)
Glucose, Bld: 98 mg/dL (ref 65–99)
POTASSIUM: 3.4 mmol/L — AB (ref 3.5–5.1)
SODIUM: 142 mmol/L (ref 135–145)

## 2015-05-25 LAB — PROTIME-INR
INR: 1.18 (ref 0.00–1.49)
PROTHROMBIN TIME: 15.1 s (ref 11.6–15.2)

## 2015-05-25 NOTE — Pre-Procedure Instructions (Signed)
    Angie FavaDiane Antonini  05/25/2015      [facility] - [place], [place] - [id number] [place] [id number] Phone: [phone] Fax: [phone]  [id number] - [place] - [address] [place] [address] Phone: [phone] Fax: [phone]    Your procedure is scheduled on Thursday, November 17th, 2016.  [facility] Admitting at 11:30 A.M.  Call this number if you have problems the morning of surgery:  [phone]   Remember:  Do not eat food or drink liquids after midnight.   Take these medicines the morning of surgery with A SIP OF WATER: Diltiazem (Cardizem),  Pantoprazole (Protonix), Ventolin inhaler if needed (please bring with you).  Stop taking: Meloxicam (Mobic), Aspirin, NSAIDS, Aleve, Naproxen, Ibuprofen, Advil, Motrin, BC"s, Goody's, Fish oil, all herbal medications, and all vitamins.    Do not wear jewelry, make-up or nail polish.  Do not wear lotions, powders, or perfumes.  You may NOT wear deodorant.  Do not shave 48 hours prior to surgery.    Do not bring valuables to the hospital.  [facility] is not responsible for any belongings or valuables.  Contacts, dentures or bridgework may not be worn into surgery.  Leave your suitcase in the car.  After surgery it may be brought to your room.  For patients admitted to the hospital, discharge time will be determined by your treatment team.  Patients discharged the day of surgery will not be allowed to drive home.   Special instructions:  See attached.   Please read over the following fact sheets that you were given. Pain Booklet, Coughing and Deep Breathing, Blood Transfusion Information, MRSA Information and Surgical Site Infection Prevention

## 2015-05-25 NOTE — Progress Notes (Signed)
Patient states that they had a difficult time putting her to sleep with trigger finger surgery at [facility].  Requested anesthesiology records.

## 2015-05-25 NOTE — Progress Notes (Signed)
PCP - Luisa Hartiffany Allen, NP Cardiologist - denies  EKG- 05/25/15 CXR - 05/25/15  Echo- 2010- epic Stress test - > 10 years ago Cardiac Cath - 2005  (0 stents)  Patient denies shortness of breath and chest pain at PAT appointment.

## 2015-05-25 NOTE — Progress Notes (Signed)
Patient declined PCR swab.  Patient educated on importance of swab and verbalized understanding and risks.

## 2015-05-25 NOTE — Progress Notes (Signed)
   05/25/15 1535  OBSTRUCTIVE SLEEP APNEA  Have you ever been diagnosed with sleep apnea through a sleep study? No  Do you snore loudly (loud enough to be heard through closed doors)?  1  Do you often feel tired, fatigued, or sleepy during the daytime (such as falling asleep during driving or talking to someone)? 1  Has anyone observed you stop breathing during your sleep? 1  Do you have, or are you being treated for high blood pressure? 1  BMI more than 35 kg/m2? 1  Age > 50 (1-yes) 1  Neck circumference greater than:Female 16 inches or larger, Female 17inches or larger? 0  Female Gender (Yes=1) 0  Obstructive Sleep Apnea Score 6

## 2015-05-26 MED ORDER — CEFAZOLIN SODIUM-DEXTROSE 2-3 GM-% IV SOLR
2.0000 g | INTRAVENOUS | Status: AC
  Administered 2015-05-27: 2 g via INTRAVENOUS
  Filled 2015-05-26: qty 50

## 2015-05-26 MED ORDER — DEXAMETHASONE SODIUM PHOSPHATE 10 MG/ML IJ SOLN
10.0000 mg | INTRAMUSCULAR | Status: AC
  Administered 2015-05-27: 10 mg via INTRAVENOUS
  Filled 2015-05-26: qty 1

## 2015-05-26 NOTE — Progress Notes (Signed)
Anesthesia Chart Review: Patient is a 54 year old female scheduled for C4-5, C5-6 ACDF on 05/27/15 by Dr. Yetta BarreJones.  History includes never smoker, post-operative N/V, HTN, HLD, palpitations, pancreatitis '12, asthma, hiatal hernia, GERD, arthritis, non-compliance. Hypothyroidism is listed but she denied. She reports she was difficult to put to sleep for a trigger finger surgery at [facility]. OSA screening score was 6. BMI is consistent with morbid obesity. PCP is Luisa Hartiffany Allen, FNP.   Meds include Lipitor, diltiazem, HCTZ, Mobic, Protonix, Ventolin.  05/25/15 EKG: NSR, non-specific T wave abnormality, prolonged QT. Non-specific T wave abnormality (T wave flattening in lateral leads) appears new since 01/31/13 and QTc has increased from 449 ms to 463 ms since then. Remote history of stress and cath (did not require any intervention) >10 years ago. Per Dr. Prescott GumBensimhon's 06/16/09 note, "Had cardiac cath in 2005 with Dr. Allyson SabalBerry. 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  06/22/09 Echo: Study Conclusions - Left ventricle: The cavity size was normal. Wall thickness was  increased in a pattern of mild LVH. Systolic function was normal.  The estimated ejection fraction was in the range of 55% to 60%. - Aortic valve: Valve area: 2.55cm^2 (Vmax). - Left atrium: The atrium was mildly dilated. - Atrial septum: No defect or patent foramen ovale was identified. - Pulmonic valve: Peak gradient: 11mm Hg (S).  05/25/15 CXR: IMPRESSION: No acute cardiopulmonary disease.  Preoperative labs noted. Patient refused nasal PCR testing at PAT.  If no acute changes then I anticipate that she can proceed as scheduled.  Joan Ochsllison Brendin Situ, PA-C [facility] Stay [facility]/Anesthesiology Phone [phone] 05/26/2015 10:13 AM

## 2015-05-27 ENCOUNTER — Ambulatory Visit (HOSPITAL_COMMUNITY)
Admission: RE | Admit: 2015-05-27 | Discharge: 2015-05-28 | Disposition: A | Discharge status: Home / Self Care | Payer: BLUE CROSS/BLUE SHIELD | Source: Ambulatory Visit | Attending: Neurological Surgery | Admitting: Neurological Surgery

## 2015-05-27 ENCOUNTER — Ambulatory Visit (HOSPITAL_COMMUNITY): Payer: BLUE CROSS/BLUE SHIELD | Admitting: Certified Registered Nurse Anesthetist

## 2015-05-27 ENCOUNTER — Encounter (HOSPITAL_COMMUNITY)
Admission: RE | Disposition: A | Discharge status: Home / Self Care | Payer: Self-pay | Source: Ambulatory Visit | Attending: Neurological Surgery

## 2015-05-27 ENCOUNTER — Ambulatory Visit (HOSPITAL_COMMUNITY): Payer: BLUE CROSS/BLUE SHIELD

## 2015-05-27 ENCOUNTER — Encounter (HOSPITAL_COMMUNITY): Payer: Self-pay | Admitting: *Deleted

## 2015-05-27 ENCOUNTER — Ambulatory Visit (HOSPITAL_COMMUNITY): Payer: BLUE CROSS/BLUE SHIELD | Admitting: Vascular Surgery

## 2015-05-27 DIAGNOSIS — Z419 Encounter for procedure for purposes other than remedying health state, unspecified: Secondary | ICD-10-CM

## 2015-05-27 DIAGNOSIS — J45909 Unspecified asthma, uncomplicated: Secondary | ICD-10-CM | POA: Diagnosis not present

## 2015-05-27 DIAGNOSIS — K219 Gastro-esophageal reflux disease without esophagitis: Secondary | ICD-10-CM | POA: Diagnosis not present

## 2015-05-27 DIAGNOSIS — M1990 Unspecified osteoarthritis, unspecified site: Secondary | ICD-10-CM | POA: Insufficient documentation

## 2015-05-27 DIAGNOSIS — Z79899 Other long term (current) drug therapy: Secondary | ICD-10-CM | POA: Diagnosis not present

## 2015-05-27 DIAGNOSIS — E785 Hyperlipidemia, unspecified: Secondary | ICD-10-CM | POA: Insufficient documentation

## 2015-05-27 DIAGNOSIS — Z791 Long term (current) use of non-steroidal anti-inflammatories (NSAID): Secondary | ICD-10-CM | POA: Diagnosis not present

## 2015-05-27 DIAGNOSIS — I10 Essential (primary) hypertension: Secondary | ICD-10-CM | POA: Insufficient documentation

## 2015-05-27 DIAGNOSIS — M4802 Spinal stenosis, cervical region: Secondary | ICD-10-CM | POA: Diagnosis not present

## 2015-05-27 DIAGNOSIS — M47812 Spondylosis without myelopathy or radiculopathy, cervical region: Secondary | ICD-10-CM | POA: Insufficient documentation

## 2015-05-27 DIAGNOSIS — M5021 Other cervical disc displacement,  high cervical region: Secondary | ICD-10-CM | POA: Insufficient documentation

## 2015-05-27 DIAGNOSIS — Z6841 Body Mass Index (BMI) 40.0 and over, adult: Secondary | ICD-10-CM | POA: Insufficient documentation

## 2015-05-27 DIAGNOSIS — Z981 Arthrodesis status: Secondary | ICD-10-CM

## 2015-05-27 HISTORY — PX: ANTERIOR CERVICAL DECOMP/DISCECTOMY FUSION: SHX1161

## 2015-05-27 SURGERY — ANTERIOR CERVICAL DECOMPRESSION/DISCECTOMY FUSION 2 LEVELS
Anesthesia: General | Site: Neck

## 2015-05-27 MED ORDER — PROPOFOL 10 MG/ML IV BOLUS
INTRAVENOUS | Status: DC | PRN
  Administered 2015-05-27: 200 mg via INTRAVENOUS

## 2015-05-27 MED ORDER — HYDROMORPHONE HCL 1 MG/ML IJ SOLN
INTRAMUSCULAR | Status: AC
  Administered 2015-05-27: 0.5 mg via INTRAVENOUS
  Filled 2015-05-27: qty 1

## 2015-05-27 MED ORDER — ALBUTEROL SULFATE (2.5 MG/3ML) 0.083% IN NEBU: 2.5000 mg | INHALATION_SOLUTION | RESPIRATORY_TRACT | Status: DC | PRN

## 2015-05-27 MED ORDER — METHOCARBAMOL 1000 MG/10ML IJ SOLN
500.0000 mg | Freq: Four times a day (QID) | INTRAVENOUS | Status: DC | PRN
  Filled 2015-05-27: qty 5

## 2015-05-27 MED ORDER — ONDANSETRON HCL 4 MG/2ML IJ SOLN
INTRAMUSCULAR | Status: AC
  Filled 2015-05-27: qty 2

## 2015-05-27 MED ORDER — SUGAMMADEX SODIUM 500 MG/5ML IV SOLN
INTRAVENOUS | Status: AC
  Filled 2015-05-27: qty 5

## 2015-05-27 MED ORDER — PHENOL 1.4 % MT LIQD: 1.0000 | OROMUCOSAL | Status: DC | PRN

## 2015-05-27 MED ORDER — SUGAMMADEX SODIUM 200 MG/2ML IV SOLN
INTRAVENOUS | Status: DC | PRN
  Administered 2015-05-27: 500 mg via INTRAVENOUS

## 2015-05-27 MED ORDER — ACETAMINOPHEN 650 MG RE SUPP
650.0000 mg | RECTAL | Status: DC | PRN
Start: 2015-05-27 — End: 2015-05-28

## 2015-05-27 MED ORDER — THROMBIN 5000 UNITS EX SOLR
CUTANEOUS | Status: DC | PRN
  Administered 2015-05-27: 14:00:00 via TOPICAL

## 2015-05-27 MED ORDER — HYDROMORPHONE HCL 1 MG/ML IJ SOLN
0.5000 mg | INTRAMUSCULAR | Status: DC | PRN
  Administered 2015-05-27 (×2): 0.5 mg via INTRAVENOUS

## 2015-05-27 MED ORDER — SODIUM CHLORIDE 0.9 % IJ SOLN: 3.0000 mL | INTRAMUSCULAR | Status: DC | PRN

## 2015-05-27 MED ORDER — PROPOFOL 10 MG/ML IV BOLUS
INTRAVENOUS | Status: AC
  Filled 2015-05-27: qty 20

## 2015-05-27 MED ORDER — CEFAZOLIN SODIUM-DEXTROSE 2-3 GM-% IV SOLR
2.0000 g | Freq: Three times a day (TID) | INTRAVENOUS | Status: AC
  Administered 2015-05-27 – 2015-05-28 (×2): 2 g via INTRAVENOUS
  Filled 2015-05-27 (×2): qty 50

## 2015-05-27 MED ORDER — SCOPOLAMINE 1 MG/3DAYS TD PT72
MEDICATED_PATCH | TRANSDERMAL | Status: AC
  Administered 2015-05-27: 1 via TRANSDERMAL
  Filled 2015-05-27: qty 1

## 2015-05-27 MED ORDER — MORPHINE SULFATE (PF) 2 MG/ML IV SOLN
1.0000 mg | INTRAVENOUS | Status: DC | PRN
  Administered 2015-05-27 (×2): 2 mg via INTRAVENOUS
  Filled 2015-05-27: qty 1

## 2015-05-27 MED ORDER — HYDROCODONE-ACETAMINOPHEN 7.5-325 MG PO TABS: 1.0000 | ORAL_TABLET | Freq: Once | ORAL | Status: DC | PRN

## 2015-05-27 MED ORDER — PHENYLEPHRINE 40 MCG/ML (10ML) SYRINGE FOR IV PUSH (FOR BLOOD PRESSURE SUPPORT)
PREFILLED_SYRINGE | INTRAVENOUS | Status: AC
  Filled 2015-05-27: qty 30

## 2015-05-27 MED ORDER — EPHEDRINE SULFATE 50 MG/ML IJ SOLN
INTRAMUSCULAR | Status: DC | PRN
  Administered 2015-05-27 (×2): 10 mg via INTRAVENOUS

## 2015-05-27 MED ORDER — MENTHOL 3 MG MT LOZG: 1.0000 | LOZENGE | OROMUCOSAL | Status: DC | PRN

## 2015-05-27 MED ORDER — FENTANYL CITRATE (PF) 100 MCG/2ML IJ SOLN
INTRAMUSCULAR | Status: DC | PRN
  Administered 2015-05-27: 50 ug via INTRAVENOUS
  Administered 2015-05-27: 200 ug via INTRAVENOUS
  Administered 2015-05-27 (×3): 50 ug via INTRAVENOUS

## 2015-05-27 MED ORDER — MORPHINE SULFATE (PF) 2 MG/ML IV SOLN
INTRAVENOUS | Status: AC
  Administered 2015-05-27: 2 mg via INTRAVENOUS
  Filled 2015-05-27: qty 1

## 2015-05-27 MED ORDER — METHOCARBAMOL 500 MG PO TABS: 500.0000 mg | ORAL_TABLET | Freq: Four times a day (QID) | ORAL | Status: DC | PRN

## 2015-05-27 MED ORDER — PANTOPRAZOLE SODIUM 40 MG PO TBEC
40.0000 mg | DELAYED_RELEASE_TABLET | Freq: Two times a day (BID) | ORAL | Status: DC
  Administered 2015-05-27 – 2015-05-28 (×2): 40 mg via ORAL
  Filled 2015-05-27 (×2): qty 1

## 2015-05-27 MED ORDER — LACTATED RINGERS IV SOLN
INTRAVENOUS | Status: DC
  Administered 2015-05-27 (×2): via INTRAVENOUS

## 2015-05-27 MED ORDER — ACETAMINOPHEN 325 MG PO TABS: 650.0000 mg | ORAL_TABLET | ORAL | Status: DC | PRN

## 2015-05-27 MED ORDER — ALBUTEROL SULFATE HFA 108 (90 BASE) MCG/ACT IN AERS: 1.0000 | INHALATION_SPRAY | RESPIRATORY_TRACT | Status: DC | PRN

## 2015-05-27 MED ORDER — THROMBIN 5000 UNITS EX SOLR
CUTANEOUS | Status: DC | PRN
  Administered 2015-05-27 (×2): 5000 [IU] via TOPICAL

## 2015-05-27 MED ORDER — 0.9 % SODIUM CHLORIDE (POUR BTL) OPTIME
TOPICAL | Status: DC | PRN
  Administered 2015-05-27: 1000 mL

## 2015-05-27 MED ORDER — ROCURONIUM BROMIDE 100 MG/10ML IV SOLN
INTRAVENOUS | Status: DC | PRN
  Administered 2015-05-27: 25 mg via INTRAVENOUS
  Administered 2015-05-27: 50 mg via INTRAVENOUS

## 2015-05-27 MED ORDER — POTASSIUM CHLORIDE IN NACL 20-0.9 MEQ/L-% IV SOLN
INTRAVENOUS | Status: DC
  Administered 2015-05-27: 21:00:00 via INTRAVENOUS
  Filled 2015-05-27 (×3): qty 1000

## 2015-05-27 MED ORDER — LIDOCAINE HCL (CARDIAC) 20 MG/ML IV SOLN
INTRAVENOUS | Status: AC
  Filled 2015-05-27: qty 5

## 2015-05-27 MED ORDER — LOSARTAN POTASSIUM 50 MG PO TABS
50.0000 mg | ORAL_TABLET | Freq: Every day | ORAL | Status: DC
  Administered 2015-05-28: 50 mg via ORAL
  Filled 2015-05-27 (×2): qty 1

## 2015-05-27 MED ORDER — DILTIAZEM HCL ER COATED BEADS 180 MG PO CP24
180.0000 mg | ORAL_CAPSULE | Freq: Every day | ORAL | Status: DC
  Administered 2015-05-28: 180 mg via ORAL
  Filled 2015-05-27: qty 1

## 2015-05-27 MED ORDER — SODIUM CHLORIDE 0.9 % IJ SOLN: 3.0000 mL | Freq: Two times a day (BID) | INTRAMUSCULAR | Status: DC

## 2015-05-27 MED ORDER — SODIUM CHLORIDE 0.9 % IV SOLN: 250.0000 mL | INTRAVENOUS | Status: DC

## 2015-05-27 MED ORDER — ONDANSETRON HCL 4 MG/2ML IJ SOLN
INTRAMUSCULAR | Status: DC | PRN
  Administered 2015-05-27: 4 mg via INTRAVENOUS

## 2015-05-27 MED ORDER — FENTANYL CITRATE (PF) 250 MCG/5ML IJ SOLN
INTRAMUSCULAR | Status: AC
  Filled 2015-05-27: qty 5

## 2015-05-27 MED ORDER — HYDROMORPHONE HCL 1 MG/ML IJ SOLN
0.2500 mg | INTRAMUSCULAR | Status: DC | PRN
  Administered 2015-05-27 (×4): 0.5 mg via INTRAVENOUS

## 2015-05-27 MED ORDER — SODIUM CHLORIDE 0.9 % IR SOLN
Status: DC | PRN
  Administered 2015-05-27: 14:00:00

## 2015-05-27 MED ORDER — METHOCARBAMOL 1000 MG/10ML IJ SOLN
500.0000 mg | INTRAVENOUS | Status: AC
  Administered 2015-05-27: 500 mg via INTRAVENOUS
  Filled 2015-05-27: qty 5

## 2015-05-27 MED ORDER — MIDAZOLAM HCL 2 MG/2ML IJ SOLN
INTRAMUSCULAR | Status: AC
  Filled 2015-05-27: qty 2

## 2015-05-27 MED ORDER — DIPHENHYDRAMINE HCL 50 MG/ML IJ SOLN
INTRAMUSCULAR | Status: DC | PRN
  Administered 2015-05-27: 12.5 mg via INTRAVENOUS

## 2015-05-27 MED ORDER — HYDROCODONE-ACETAMINOPHEN 5-325 MG PO TABS
ORAL_TABLET | ORAL | Status: AC
  Administered 2015-05-27: 2 via ORAL
  Filled 2015-05-27: qty 2

## 2015-05-27 MED ORDER — HEMOSTATIC AGENTS (NO CHARGE) OPTIME
TOPICAL | Status: DC | PRN
  Administered 2015-05-27: 1 via TOPICAL

## 2015-05-27 MED ORDER — ONDANSETRON HCL 4 MG/2ML IJ SOLN
4.0000 mg | INTRAMUSCULAR | Status: DC | PRN
  Administered 2015-05-27 (×2): 4 mg via INTRAVENOUS
  Filled 2015-05-27: qty 2

## 2015-05-27 MED ORDER — SENNA 8.6 MG PO TABS
1.0000 | ORAL_TABLET | Freq: Two times a day (BID) | ORAL | Status: DC
Start: 2015-05-27 — End: 2015-05-28
  Administered 2015-05-27 – 2015-05-28 (×2): 8.6 mg via ORAL
  Filled 2015-05-27 (×2): qty 1

## 2015-05-27 MED ORDER — OXYCODONE-ACETAMINOPHEN 5-325 MG PO TABS: 1.0000 | ORAL_TABLET | ORAL | Status: DC | PRN

## 2015-05-27 MED ORDER — LIDOCAINE HCL (CARDIAC) 20 MG/ML IV SOLN
INTRAVENOUS | Status: DC | PRN
  Administered 2015-05-27: 80 mg via INTRAVENOUS

## 2015-05-27 MED ORDER — HYDROCODONE-ACETAMINOPHEN 5-325 MG PO TABS
1.0000 | ORAL_TABLET | ORAL | Status: DC | PRN
  Administered 2015-05-27 – 2015-05-28 (×3): 2 via ORAL
  Filled 2015-05-27 (×2): qty 2

## 2015-05-27 MED ORDER — HYDROCHLOROTHIAZIDE 25 MG PO TABS
25.0000 mg | ORAL_TABLET | Freq: Every day | ORAL | Status: DC
  Administered 2015-05-28: 25 mg via ORAL
  Filled 2015-05-27: qty 1

## 2015-05-27 MED ORDER — PHENYLEPHRINE HCL 10 MG/ML IJ SOLN
INTRAMUSCULAR | Status: DC | PRN
  Administered 2015-05-27 (×4): 80 ug via INTRAVENOUS
  Administered 2015-05-27: 40 ug via INTRAVENOUS

## 2015-05-27 SURGICAL SUPPLY — 47 items
BAG DECANTER FOR FLEXI CONT (MISCELLANEOUS) ×3 IMPLANT
BENZOIN TINCTURE PRP APPL 2/3 (GAUZE/BANDAGES/DRESSINGS) ×3 IMPLANT
BIT DRILL POWER (BIT) ×1 IMPLANT
BONE MATRIX OSTEOCEL PRO SM (Bone Implant) ×3 IMPLANT
BUR MATCHSTICK NEURO 3.0 LAGG (BURR) ×3 IMPLANT
CAGE COROENT SM 6X13X15 (Cage) ×6 IMPLANT
CANISTER SUCT 3000ML PPV (MISCELLANEOUS) ×3 IMPLANT
CLOSURE WOUND 1/2 X4 (GAUZE/BANDAGES/DRESSINGS) ×1
DRAPE C-ARM 42X72 X-RAY (DRAPES) ×6 IMPLANT
DRAPE LAPAROTOMY 100X72 PEDS (DRAPES) ×3 IMPLANT
DRAPE MICROSCOPE LEICA (MISCELLANEOUS) ×3 IMPLANT
DRAPE POUCH INSTRU U-SHP 10X18 (DRAPES) ×3 IMPLANT
DRILL BIT POWER (BIT) ×2
DRSG OPSITE POSTOP 3X4 (GAUZE/BANDAGES/DRESSINGS) ×3 IMPLANT
DURAPREP 6ML APPLICATOR 50/CS (WOUND CARE) ×3 IMPLANT
ELECT COATED BLADE 2.86 ST (ELECTRODE) ×3 IMPLANT
ELECT REM PT RETURN 9FT ADLT (ELECTROSURGICAL) ×3
ELECTRODE REM PT RTRN 9FT ADLT (ELECTROSURGICAL) ×1 IMPLANT
GAUZE SPONGE 4X4 16PLY XRAY LF (GAUZE/BANDAGES/DRESSINGS) IMPLANT
GLOVE BIO SURGEON STRL SZ8 (GLOVE) ×3 IMPLANT
GLOVE BIOGEL PI IND STRL 7.0 (GLOVE) ×4 IMPLANT
GLOVE BIOGEL PI INDICATOR 7.0 (GLOVE) ×8
GLOVE ECLIPSE 6.5 STRL STRAW (GLOVE) ×3 IMPLANT
GOWN STRL REUS W/ TWL LRG LVL3 (GOWN DISPOSABLE) ×2 IMPLANT
GOWN STRL REUS W/ TWL XL LVL3 (GOWN DISPOSABLE) ×1 IMPLANT
GOWN STRL REUS W/TWL 2XL LVL3 (GOWN DISPOSABLE) IMPLANT
GOWN STRL REUS W/TWL LRG LVL3 (GOWN DISPOSABLE) ×4
GOWN STRL REUS W/TWL XL LVL3 (GOWN DISPOSABLE) ×2
HEMOSTAT POWDER KIT SURGIFOAM (HEMOSTASIS) ×3 IMPLANT
KIT BASIN OR (CUSTOM PROCEDURE TRAY) ×3 IMPLANT
KIT ROOM TURNOVER OR (KITS) ×3 IMPLANT
NEEDLE HYPO 25X1 1.5 SAFETY (NEEDLE) ×3 IMPLANT
NEEDLE SPNL 20GX3.5 QUINCKE YW (NEEDLE) ×3 IMPLANT
NS IRRIG 1000ML POUR BTL (IV SOLUTION) ×3 IMPLANT
PACK LAMINECTOMY NEURO (CUSTOM PROCEDURE TRAY) ×3 IMPLANT
PAD ARMBOARD 7.5X6 YLW CONV (MISCELLANEOUS) ×9 IMPLANT
PLATE ARCHON 2-LEVEL 36MM (Plate) ×3 IMPLANT
RUBBERBAND STERILE (MISCELLANEOUS) ×6 IMPLANT
SCREW ARCHON SELFTAP 4.0X13 (Screw) ×18 IMPLANT
SPONGE INTESTINAL PEANUT (DISPOSABLE) ×3 IMPLANT
SPONGE SURGIFOAM ABS GEL SZ50 (HEMOSTASIS) ×3 IMPLANT
STRIP CLOSURE SKIN 1/2X4 (GAUZE/BANDAGES/DRESSINGS) ×2 IMPLANT
SUT VIC AB 3-0 SH 8-18 (SUTURE) ×6 IMPLANT
TOWEL OR 17X24 6PK STRL BLUE (TOWEL DISPOSABLE) ×3 IMPLANT
TOWEL OR 17X26 10 PK STRL BLUE (TOWEL DISPOSABLE) ×3 IMPLANT
TRAP SPECIMEN MUCOUS 40CC (MISCELLANEOUS) ×3 IMPLANT
WATER STERILE IRR 1000ML POUR (IV SOLUTION) ×3 IMPLANT

## 2015-05-27 NOTE — Op Note (Signed)
05/27/2015  2:50 PM  PATIENT:  Joan Salazar  54 y.o. female  PRE-OPERATIVE DIAGNOSIS:  Cervical spondylosis with cervical spinal stenosis C4-5 C5-6 with neck and left arm pain  POST-OPERATIVE DIAGNOSIS:  same  PROCEDURE:  1. Decompressive anterior cervical discectomy C4-5 C5-6, 2. Anterior screw arthrodesis C4-5 C5-6 utilizing peek interbody cages packed with local autograft morselized allograft, 3. Anterior plating C4-C6 utilizing a Nuvasive archon plate  SURGEON:  Marikay Alaravid Jones, MD  ASSISTANTS: Dr. Mikal Planeabell  ANESTHESIA:   General  EBL: less than 100 ml  Total I/O In: 1000 [I.V.:1000] Out: 150 [Blood:150]  BLOOD ADMINISTERED:none  DRAINS: none   SPECIMEN:  No Specimen  INDICATION FOR PROCEDURE: this patient presented with neck and left arm pain. The pain was chronic and unresponsive to medical therapy. Cervical MRI and CT scan showed spondylosis with stenosis at C4-5 and C5-6. Recommended decompressive anterior cervical discectomy C4-5 and C5-6. Patient understood the risks, benefits, and alternatives and potential outcomes and wished to proceed.  PROCEDURE DETAILS: Patient was brought to the operating room placed under general endotracheal anesthesia. Patient was placed in the supine position on the operating room table. The neck was prepped with Duraprep and draped in a sterile fashion.   Three cc of local anesthesia was injected and a transverse incision was made on the right side of the neck.  Dissection was carried down thru the subcutaneous tissue and the platysma was  elevated, opened, and undermined with Metzenbaum scissors.  Dissection was then carried out thru an avascular plane leaving the sternocleidomastoid carotid artery and jugular vein laterally and the trachea and esophagus medially. The ventral aspect of the vertebral column was identified and a localizing x-ray was taken. The C4-5 level was identified. The longus colli muscles were then elevated and the retractor was  placed to expose C4-5 and C5-6. The annulus was incised and the disc space entered. Discectomy was performed with micro-curettes and pituitary rongeurs. I then used the high-speed drill to drill the endplates down to the level of the posterior longitudinal ligament at both levels. The drill shavings were saved in a mucous trap for later arthrodesis. The operating microscope was draped and brought into the field provided additional magnification, illumination and visualization. Discectomy was continued posteriorly thru the disc space. Posterior longitudinal ligament was opened with a nerve hook, and then removed along with disc herniation and osteophytes, decompressing the spinal canal and thecal sac. There were large left-sided osteophytes at C4-5 and C5-6 on the left causing significant compression. These were bitten away. We then continued to remove osteophytic overgrowth and disc material decompressing the neural foramina and exiting nerve roots bilaterally. The scope was angled up and down to help decompress and undercut the vertebral bodies. Once the decompression was completed we could pass a nerve hook circumferentially to assure adequate decompression in the midline and in the neural foramina. So by both visualization and palpation we felt we had an adequate decompression of the neural elements. We then measured the height of the intravertebral disc space and selected a 6 millimeter Peek interbody cage packed with autograft and morcellized allograft. It was then gently positioned in the intravertebral disc space and countersunk at both levels. I then used a 36 mm plate and placed variable angle screws into the vertebral bodies and locked them into position. The wound was irrigated with bacitracin solution, checked for hemostasis which was established and confirmed. Once meticulous hemostasis was achieved, we then proceeded with closure. The platysma was closed with interrupted  3-0 undyed Vicryl suture, the  subcuticular layer was closed with interrupted 3-0 undyed Vicryl suture. The skin edges were approximated with steristrips. The drapes were removed. A sterile dressing was applied. The patient was then awakened from general anesthesia and transferred to the recovery room in stable condition. At the end of the procedure all sponge, needle and instrument counts were correct.   PLAN OF CARE: Admit for overnight observation  PATIENT DISPOSITION:  PACU - hemodynamically stable.   Delay start of Pharmacological VTE agent (>24hrs) due to surgical blood loss or risk of bleeding:  yes

## 2015-05-27 NOTE — Transfer of Care (Signed)
Immediate Anesthesia Transfer of Care Note  Patient: Joan Salazar  Procedure(s) Performed: Procedure(s): Anterior Cervical Diskectomy and Fusion-Cervical four-five, Cervical five-six  (N/A)  Patient Location: PACU  Anesthesia Type:General  Level of Consciousness: awake, alert , oriented and patient cooperative  Airway & Oxygen Therapy: Patient Spontanous Breathing and Patient connected to nasal cannula oxygen  Post-op Assessment: Report given to RN, Post -op Vital signs reviewed and stable and Patient moving all extremities X 4  Post vital signs: Reviewed and stable  Last Vitals:  Filed Vitals:   05/27/15 1136  BP: 119/74  Pulse: 65  Temp: 36.8 C  Resp: 18    Complications: No apparent anesthesia complications

## 2015-05-27 NOTE — Anesthesia Preprocedure Evaluation (Signed)
Anesthesia Evaluation  Patient identified by MRN, date of birth, ID band Patient awake    Reviewed: Allergy & Precautions, NPO status , Patient's Chart, lab work & pertinent test results  History of Anesthesia Complications (+) PONV and history of anesthetic complications  Airway Mallampati: II  TM Distance: >3 FB Neck ROM: Full    Dental  (+) Teeth Intact   Pulmonary neg shortness of breath, asthma , neg sleep apnea, neg COPD, neg recent URI,    breath sounds clear to auscultation       Cardiovascular hypertension, Pt. on medications  Rhythm:Regular     Neuro/Psych  Headaches,  Neuromuscular disease    GI/Hepatic Neg liver ROS, hiatal hernia, GERD  Medicated and Controlled,  Endo/Other  neg diabetesMorbid obesity  Renal/GU negative Renal ROS     Musculoskeletal  (+) Arthritis ,   Abdominal   Peds  Hematology negative hematology ROS (+)   Anesthesia Other Findings   Reproductive/Obstetrics                             Anesthesia Physical Anesthesia Plan  ASA: II  Anesthesia Plan: General   Post-op Pain Management:    Induction: Intravenous  Airway Management Planned: Oral ETT  Additional Equipment: None  Intra-op Plan:   Post-operative Plan: Extubation in OR  Informed Consent: I have reviewed the patients History and Physical, chart, labs and discussed the procedure including the risks, benefits and alternatives for the proposed anesthesia with the patient or authorized representative who has indicated his/her understanding and acceptance.   Dental advisory given  Plan Discussed with: CRNA and Surgeon  Anesthesia Plan Comments:         Anesthesia Quick Evaluation

## 2015-05-27 NOTE — Anesthesia Procedure Notes (Signed)
Procedure Name: Intubation Date/Time: 05/27/2015 1:07 PM Performed by: Sarita HaverFLOWERS, Joan Connole T Pre-anesthesia Checklist: Patient identified, Timeout performed, Emergency Drugs available, Suction available and Patient being monitored Patient Re-evaluated:Patient Re-evaluated prior to inductionOxygen Delivery Method: Circle system utilized and Simple face mask Preoxygenation: Pre-oxygenation with 100% oxygen Intubation Type: IV induction Ventilation: Mask ventilation without difficulty and Oral airway inserted - appropriate to patient size Laryngoscope Size: Hyacinth MeekerMiller and 2 Grade View: Grade I Tube type: Oral Tube size: 7.5 mm Number of attempts: 1 Airway Equipment and Method: Patient positioned with wedge pillow and Stylet Placement Confirmation: ETT inserted through vocal cords under direct vision,  positive ETCO2 and breath sounds checked- equal and bilateral Secured at: 22 cm Tube secured with: Tape Dental Injury: Teeth and Oropharynx as per pre-operative assessment

## 2015-05-27 NOTE — Anesthesia Postprocedure Evaluation (Signed)
  Anesthesia Post-op Note  Patient: Joan Salazar  Procedure(s) Performed: Procedure(s) (LRB): Anterior Cervical Diskectomy and Fusion-Cervical four-five, Cervical five-six  (N/A)  Patient Location: PACU  Anesthesia Type: General  Level of Consciousness: awake and alert   Airway and Oxygen Therapy: Patient Spontanous Breathing  Post-op Pain: mild  Post-op Assessment: Post-op Vital signs reviewed, Patient's Cardiovascular Status Stable, Respiratory Function Stable, Patent Airway and No signs of Nausea or vomiting  Last Vitals:  Filed Vitals:   05/27/15 1630  BP: 115/73  Pulse: 77  Temp:   Resp: 17    Post-op Vital Signs: stable   Complications: No apparent anesthesia complications

## 2015-05-27 NOTE — H&P (Signed)
Subjective:   Patient is a 54 y.o. female admitted for ACDF. The patient first presented to me with complaints of neck pain and shooting pains in the arm(s). Onset of symptoms was several months ago. The pain is described as aching and occurs all day. The pain is rated severe, and is located  In the neck and radiates to the arms. The symptoms have been progressive. Symptoms are exacerbated by extending head backwards, and are relieved by none.  Previous work up includes MRI of cervical spine, results: spinal stenosis.  Past Medical History  Diagnosis Date  . Hypertension   . Hyperlipidemia     stopped statins due to muscle pain  . Obesity   . Noncompliance   . Palpitations   . Acute pancreatitis 04/2011    ?secondary to HCTZ.   . Asthma   . H/O hiatal hernia   . Hypothyroidism     pt. denies  . History of pneumonia 1997  . History of depression 2008  . GERD (gastroesophageal reflux disease)   . Headache     history of migraines  . Arthritis   . Numbness and tingling in left arm   . Complication of anesthesia     pt. states that with previous surgery, she was difficult to get to sleep  . PONV (postoperative nausea and vomiting)     Past Surgical History  Procedure Laterality Date  . 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 Total abdominal hysterectomy  1991  . Tonsillectomy  1984  . Appendectomy  1979  . Colonoscopy  2011    Dr. Jeani HawkingPatrick Hung. Per pt, colon polyps and due in five years for f/u.  Marland Kitchen. Esophagogastroduodenoscopy  1990s    duodenitis per patient  . Esophagogastroduodenoscopy  05/24/2011    Procedure: ESOPHAGOGASTRODUODENOSCOPY (EGD);  Surgeon: Corbin Adeobert M Rourk, MD;  Location: [facility];  Service: Endoscopy;  Laterality: N/A;  1:05  . Trigger finger release Right 2015    thumb    Allergies  Allergen Reactions  . Percocet [Oxycodone-Acetaminophen] Itching    Terrible itching  . Codeine Itching    Bad itching     Social History  Substance Use Topics  . Smoking status: Never Smoker   . Smokeless tobacco: Not on file  . Alcohol Use: No    Family History  Problem Relation Age of Onset  . Heart attack Mother   . Ulcerative colitis Mother   . Hypertension Father   . Hyperlipidemia Father   . Hypertension Sister   . Colon cancer Neg Hx   . Liver disease Neg Hx   . Breast cancer Paternal Grandmother     >50   Prior to Admission medications   Medication Sig Start Date End Date Taking? Authorizing Provider  atorvastatin (LIPITOR) 20 MG tablet Take 20 mg by mouth daily.   Yes Historical Provider, MD  atorvastatin (LIPITOR) 40 MG tablet Take 40 mg by mouth daily.   Yes Historical Provider, MD  diltiazem (CARDIZEM CD) 180 MG 24 hr capsule Take 180 mg by mouth daily.   Yes Historical Provider, MD  hydrochlorothiazide (HYDRODIURIL) 25 MG tablet Take 25 mg by mouth daily.   Yes Historical Provider, MD  meloxicam (MOBIC) 15 MG tablet One tab PO qAM with breakfast for 2 weeks, then daily prn pain. Patient taking differently: Take 15 mg by mouth daily as needed.  10/15/14  Yes Monica Bectonhomas J Thekkekandam, MD  pantoprazole (PROTONIX) 40  MG tablet Take 1 tablet (40 mg total) by mouth 2 (two) times daily. 01/12/12  Yes Joselyn Arrow, NP  losartan (COZAAR) 50 MG tablet Take 50 mg by mouth daily.  05/01/11   Historical Provider, MD  omeprazole (PRILOSEC) 20 MG capsule Take 20 mg by mouth daily.    Historical Provider, MD  VENTOLIN HFA 108 (90 BASE) MCG/ACT inhaler Inhale 1 puff into the lungs every 4 (four) hours as needed. Shortness of breath 02/21/11   Historical Provider, MD     Review of Systems  Positive ROS: neg  All other systems have been reviewed and were otherwise negative with the exception of those mentioned in the HPI and as above.  Objective: Vital signs in last 24 hours: Temp:  [98.2 F (36.8 C)] 98.2 F (36.8 C) (11/17 1136) Pulse Rate:  [65] 65 (11/17 1136) Resp:  [18] 18 (11/17 1136) BP:  (119)/(74) 119/74 mmHg (11/17 1136) SpO2:  [97 %] 97 % (11/17 1136) Weight:  [107.865 kg (237 lb 12.8 oz)] 107.865 kg (237 lb 12.8 oz) (11/17 1136)  General Appearance: Alert, cooperative, no distress, appears stated age Head: Normocephalic, without obvious abnormality, atraumatic Eyes: PERRL, conjunctiva/corneas clear, EOM's intact      Neck: Supple, symmetrical, trachea midline, Back: Symmetric, no curvature, ROM normal, no CVA tenderness Lungs:  respirations unlabored Heart: Regular rate and rhythm Abdomen: Soft, non-tender Extremities: Extremities normal, atraumatic, no cyanosis or edema Pulses: 2+ and symmetric all extremities Skin: Skin color, texture, turgor normal, no rashes or lesions  NEUROLOGIC:  Mental status: Alert and oriented x4, no aphasia, good attention span, fund of knowledge and memory  Motor Exam - grossly normal Sensory Exam - grossly normal Reflexes: 1+ Coordination - grossly normal Gait - grossly normal Balance - grossly normal Cranial Nerves: I: smell Not tested  II: visual acuity  OS: nl    OD: nl  II: visual fields Full to confrontation  II: pupils Equal, round, reactive to light  III,VII: ptosis None  III,IV,VI: extraocular muscles  Full ROM  V: mastication Normal  V: facial light touch sensation  Normal  V,VII: corneal reflex  Present  VII: facial muscle function - upper  Normal  VII: facial muscle function - lower Normal  VIII: hearing Not tested  IX: soft palate elevation  Normal  IX,X: gag reflex Present  XI: trapezius strength  5/5  XI: sternocleidomastoid strength 5/5  XI: neck flexion strength  5/5  XII: tongue strength  Normal    Data Review Lab Results  Component Value Date   WBC 10.4 05/25/2015   HGB 14.0 05/25/2015   HCT 41.6 05/25/2015   MCV 92.2 05/25/2015   PLT 243 05/25/2015   Lab Results  Component Value Date   NA 142 05/25/2015   K 3.4* 05/25/2015   CL 106 05/25/2015   CO2 28 05/25/2015   BUN 11 05/25/2015    CREATININE 0.89 05/25/2015   GLUCOSE 98 05/25/2015   Lab Results  Component Value Date   INR 1.18 05/25/2015    Assessment:   Cervical neck pain with herniated nucleus pulposus/ spondylosis/ stenosis at C4-5, C5-6. Patient has failed conservative therapy. Planned surgery : ACDF C4-5, C5-6  Plan:   I explained the condition and procedure to the patient and answered any questions.  Patient wishes to proceed with procedure as planned. Understands risks/ benefits/ and expected or typical outcomes.  Latesha Chesney S 05/27/2015 12:21 PM

## 2015-05-28 ENCOUNTER — Other Ambulatory Visit: Payer: Self-pay | Admitting: Sports Medicine

## 2015-05-28 DIAGNOSIS — M47812 Spondylosis without myelopathy or radiculopathy, cervical region: Secondary | ICD-10-CM | POA: Diagnosis not present

## 2015-05-28 MED ORDER — HYDROCODONE-ACETAMINOPHEN 5-325 MG PO TABS: 1.0000 | ORAL_TABLET | ORAL | Status: DC | PRN

## 2015-05-28 MED ORDER — METHOCARBAMOL 500 MG PO TABS: 500.0000 mg | ORAL_TABLET | Freq: Four times a day (QID) | ORAL | Status: DC | PRN

## 2015-05-28 NOTE — Discharge Summary (Signed)
Physician Discharge Summary  Patient ID: Joan Salazar MRN: [id number] DOB/AGE: 1961/06/05 54 y.o.  Admit date: 05/27/2015 Discharge date: 05/28/2015  Admission Diagnoses: cervical stenosis    Discharge Diagnoses: same   Discharged Condition: good  Hospital Course: The patient was admitted on 05/27/2015 and taken to the operating room where the patient underwent ACDF. The patient tolerated the procedure well and was taken to the recovery room and then to the floor in stable condition. The hospital course was routine. There were no complications. The wound remained clean dry and intact. Pt had appropriate nck soreness. No complaints of arm pain or new N/T/W. The patient remained afebrile with stable vital signs, and tolerated a regular diet. The patient continued to increase activities, and pain was well controlled with oral pain medications.   Consults: None  Significant Diagnostic Studies:  Results for orders placed or performed during the hospital encounter of 05/25/15  Basic metabolic panel  Result Value Ref Range   Sodium 142 135 - 145 mmol/L   Potassium 3.4 (L) 3.5 - 5.1 mmol/L   Chloride 106 101 - 111 mmol/L   CO2 28 22 - 32 mmol/L   Glucose, Bld 98 65 - 99 mg/dL   BUN 11 6 - 20 mg/dL   Creatinine, Ser 3.47 0.44 - 1.00 mg/dL   Calcium 42.5 8.9 - 95.6 mg/dL   GFR calc non Af Amer >60 >60 mL/min   GFR calc Af Amer >60 >60 mL/min   Anion gap 8 5 - 15  CBC WITH DIFFERENTIAL  Result Value Ref Range   WBC 10.4 4.0 - 10.5 K/uL   RBC 4.51 3.87 - 5.11 MIL/uL   Hemoglobin 14.0 12.0 - 15.0 g/dL   HCT 38.7 56.4 - 33.2 %   MCV 92.2 78.0 - 100.0 fL   MCH 31.0 26.0 - 34.0 pg   MCHC 33.7 30.0 - 36.0 g/dL   RDW 95.1 88.4 - 16.6 %   Platelets 243 150 - 400 K/uL   Neutrophils Relative % 66 %   Neutro Abs 6.8 1.7 - 7.7 K/uL   Lymphocytes Relative 28 %   Lymphs Abs 2.9 0.7 - 4.0 K/uL   Monocytes Relative 5 %   Monocytes Absolute 0.5 0.1 - 1.0 K/uL   Eosinophils Relative 1 %    Eosinophils Absolute 0.1 0.0 - 0.7 K/uL   Basophils Relative 0 %   Basophils Absolute 0.0 0.0 - 0.1 K/uL  Protime-INR  Result Value Ref Range   Prothrombin Time 15.1 11.6 - 15.2 seconds   INR 1.18 0.00 - 1.49  Type and screen All Cardiac and thoracic surgeries, spinal fusions, myomectomies, craniotomies, colon & liver resections, total joint revisions, same day c-section with placenta previa or accreta.  Result Value Ref Range   ABO/RH(D) A POS    Antibody Screen NEG    Sample Expiration 06/08/2015    Extend sample reason NO TRANSFUSIONS OR PREGNANCY IN THE PAST 3 MONTHS   ABO/Rh  Result Value Ref Range   ABO/RH(D) A POS     Chest 2 View  05/26/2015  CLINICAL DATA:  Preoperative examination (ACDF). History of hypertension, asthma, cardiac catheterization (2005). EXAM: CHEST  2 VIEW COMPARISON:  04/03/2011; 06/20/2009 FINDINGS: Grossly unchanged cardiac silhouette and mediastinal contours. Veiling opacities overlying the bilateral lower lungs are favored to represent a lying breast tissues. No focal airspace opacities. No pleural effusion or pneumothorax. No evidence of edema. No acute osseus abnormalities. Stigmata of DISH within the thoracic spine. IMPRESSION: No acute  cardiopulmonary disease. Electronically Signed   By: Simonne Come M.D.   On: 05/26/2015 08:21   Dg Cervical Spine 1 View  05/27/2015  CLINICAL DATA:  Anterior and interbody fusion. EXAM: DG C-ARM 61-120 MIN; DG CERVICAL SPINE - 1 VIEW COMPARISON:  CT cervical spine 08/03/2014. FINDINGS: Anterior plate and screws and interbody fusion devices are noted at C4-5 and C5-6. No complicating features are demonstrated. Normal alignment. IMPRESSION: Anterior and interbody fusion changes at C4-5 and C5-6. Electronically Signed   By: Rudie Meyer M.D.   On: 05/27/2015 14:55   Dg C-arm 1-60 Min  05/27/2015  CLINICAL DATA:  Anterior and interbody fusion. EXAM: DG C-ARM 61-120 MIN; DG CERVICAL SPINE - 1 VIEW COMPARISON:  CT cervical  spine 08/03/2014. FINDINGS: Anterior plate and screws and interbody fusion devices are noted at C4-5 and C5-6. No complicating features are demonstrated. Normal alignment. IMPRESSION: Anterior and interbody fusion changes at C4-5 and C5-6. Electronically Signed   By: Rudie Meyer M.D.   On: 05/27/2015 14:55    Antibiotics:  Anti-infectives    Start     Dose/Rate Route Frequency Ordered Stop   05/27/15 2100  ceFAZolin (ANCEF) IVPB 2 g/50 mL premix     2 g 100 mL/hr over 30 Minutes Intravenous Every 8 hours 05/27/15 2047 05/28/15 0615   05/27/15 1355  bacitracin 50,000 Units in sodium chloride irrigation 0.9 % 500 mL irrigation  Status:  Discontinued       As needed 05/27/15 1355 05/27/15 1503   05/27/15 1300  ceFAZolin (ANCEF) IVPB 2 g/50 mL premix     2 g 100 mL/hr over 30 Minutes Intravenous To [facility] 05/26/15 1440 05/27/15 1309      Discharge Exam: Blood pressure 120/70, pulse 68, temperature 97.6 F (36.4 C), temperature source Oral, resp. rate 18, height  (1.6 m), weight 107.865 kg (237 lb 12.8 oz), SpO2 95 %. Neurologic: Grossly normal Incision CDI  Discharge Medications:     Medication List    STOP taking these medications        meloxicam 15 MG tablet  Commonly known as:  MOBIC      TAKE these medications        atorvastatin 40 MG tablet  Commonly known as:  LIPITOR  Take 40 mg by mouth daily.     atorvastatin 20 MG tablet  Commonly known as:  LIPITOR  Take 20 mg by mouth daily.     diltiazem 180 MG 24 hr capsule  Commonly known as:  CARDIZEM CD  Take 180 mg by mouth daily.     hydrochlorothiazide 25 MG tablet  Commonly known as:  HYDRODIURIL  Take 25 mg by mouth daily.     HYDROcodone-acetaminophen 5-325 MG tablet  Commonly known as:  NORCO/VICODIN  Take 1-2 tablets by mouth every 4 (four) hours as needed (mild pain).     losartan 50 MG tablet  Commonly known as:  COZAAR  Take 50 mg by mouth daily.     methocarbamol 500 MG tablet   Commonly known as:  ROBAXIN  Take 1 tablet (500 mg total) by mouth every 6 (six) hours as needed for muscle spasms.     omeprazole 20 MG capsule  Commonly known as:  PRILOSEC  Take 20 mg by mouth daily.     pantoprazole 40 MG tablet  Commonly known as:  PROTONIX  Take 1 tablet (40 mg total) by mouth 2 (two) times daily.     VENTOLIN HFA  108 (90 BASE) MCG/ACT inhaler  Generic drug:  albuterol  Inhale 1 puff into the lungs every 4 (four) hours as needed. Shortness of breath        Disposition: home   Final Dx: ACDF C4-5, C5-6      Discharge Instructions    Call MD for:  difficulty breathing, headache or visual disturbances    Complete by:  As directed      Call MD for:  persistant nausea and vomiting    Complete by:  As directed      Call MD for:  redness, tenderness, or signs of infection (pain, swelling, redness, odor or green/yellow discharge around incision site)    Complete by:  As directed      Call MD for:  severe uncontrolled pain    Complete by:  As directed      Call MD for:  temperature >100.4    Complete by:  As directed      Diet - low sodium heart healthy    Complete by:  As directed      Discharge instructions    Complete by:  As directed   May shower, no heavy lifting or driving     Increase activity slowly    Complete by:  As directed      Remove dressing in 48 hours    Complete by:  As directed               Signed: JONES,DAVID S 05/28/2015, 10:08 AM

## 2015-05-28 NOTE — Progress Notes (Signed)
Discharge orders received, Pt for discharge home today. IV d/c'd. D/c instructions and RX given with verbalized understanding. Family at bedside to assist patient with discharge. Staff bought pt downstairs via wheelchair.  

## 2015-05-28 NOTE — Care Management Note (Signed)
Case Management Note  Patient Details  Name: Joan Salazar MRN: [id number] Date of Birth: Mar 29, 1961  Subjective/Objective:                    Action/Plan: Patient was admitted for a C4-6 ACDF. Lives at home with relatives. Patient discharging home today, self care. No further CM needs identified.  Expected Discharge Date:                  Expected Discharge Plan:  Home/Self Care  In-House Referral:     Discharge planning Services     Post Acute Care Choice:    Choice offered to:     DME Arranged:    DME Agency:     HH Arranged:    HH Agency:     Status of Service:  Completed, signed off  Medicare Important Message Given:    Date Medicare IM Given:    Medicare IM give by:    Date Additional Medicare IM Given:    Additional Medicare Important Message give by:     If discussed at Long Length of Stay Meetings, dates discussed:    Additional Comments:  Anda KraftRobarge, Jameel Quant C, RN 05/28/2015, 10:28 AM

## 2015-05-31 ENCOUNTER — Encounter (HOSPITAL_COMMUNITY): Payer: Self-pay | Admitting: Neurological Surgery

## 2015-09-03 ENCOUNTER — Other Ambulatory Visit (HOSPITAL_COMMUNITY): Payer: Self-pay | Admitting: Neurological Surgery

## 2015-09-03 DIAGNOSIS — M542 Cervicalgia: Secondary | ICD-10-CM

## 2015-09-16 ENCOUNTER — Ambulatory Visit (HOSPITAL_COMMUNITY): Payer: BLUE CROSS/BLUE SHIELD

## 2015-09-24 ENCOUNTER — Ambulatory Visit (HOSPITAL_COMMUNITY)
Admission: RE | Admit: 2015-09-24 | Discharge: 2015-09-24 | Disposition: A | Discharge status: Home / Self Care | Payer: BLUE CROSS/BLUE SHIELD | Source: Ambulatory Visit | Attending: Neurological Surgery | Admitting: Neurological Surgery

## 2015-09-24 DIAGNOSIS — M542 Cervicalgia: Secondary | ICD-10-CM

## 2015-12-29 ENCOUNTER — Other Ambulatory Visit: Payer: Self-pay | Admitting: Sports Medicine

## 2016-09-29 IMAGING — RF DG C-ARM 61-120 MIN
1 series · 1 of 1 positions shown · non-contrast
Comparison: CT cervical spine 08/03/2014.

CLINICAL DATA: Anterior and interbody fusion.

EXAM:
DG C-ARM 61-120 MIN; DG CERVICAL SPINE - 1 VIEW

[Series 1: run · 1 of 1 slices shown]
[im 1/1]
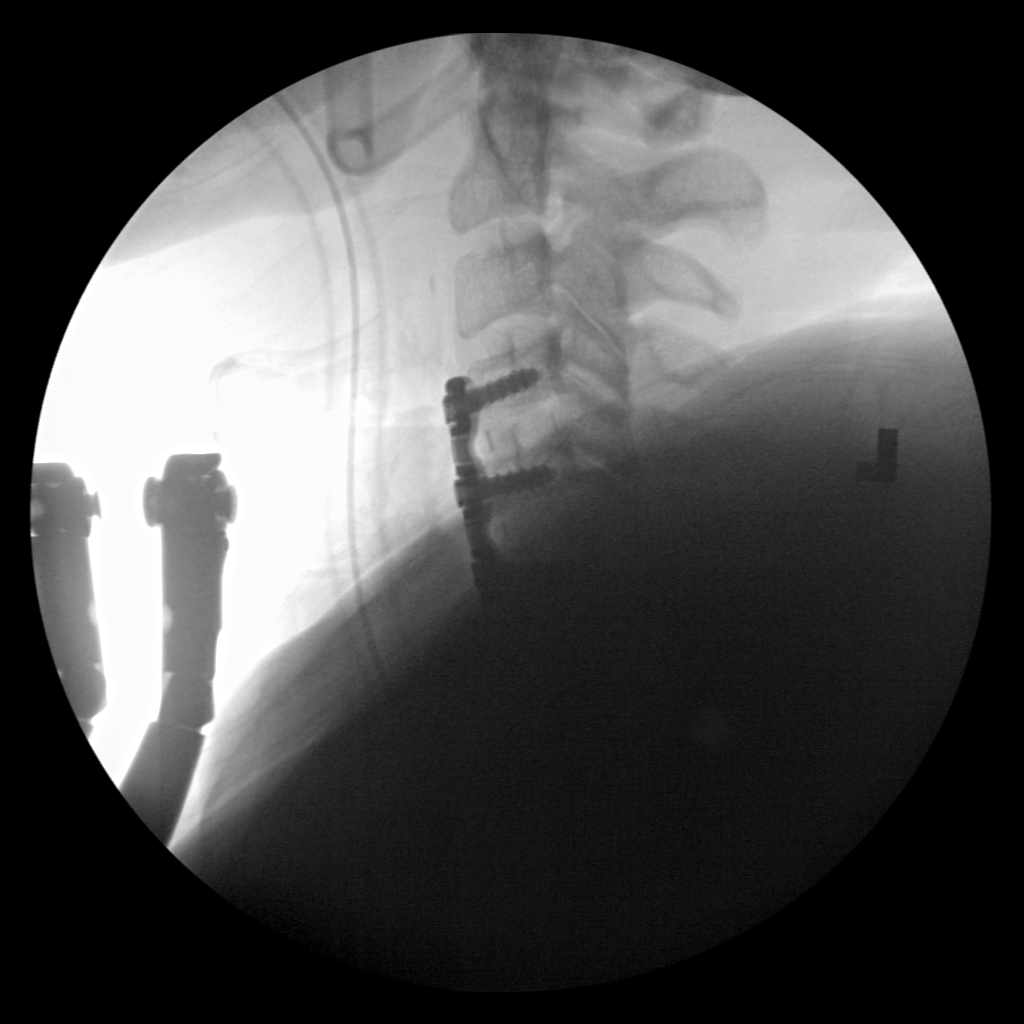

[1 of 1 positions shown; findings below may reference images not displayed]

FINDINGS: Anterior plate and screws and interbody fusion devices are noted at
C4-5 and C5-6. No complicating features are demonstrated. Normal
alignment.
IMPRESSION: Anterior and interbody fusion changes at C4-5 and C5-6.

## 2016-10-17 ENCOUNTER — Other Ambulatory Visit: Payer: Self-pay | Admitting: Family Medicine

## 2016-10-17 DIAGNOSIS — Z1231 Encounter for screening mammogram for malignant neoplasm of breast: Secondary | ICD-10-CM

## 2016-11-02 ENCOUNTER — Ambulatory Visit: Payer: BLUE CROSS/BLUE SHIELD

## 2016-11-17 ENCOUNTER — Ambulatory Visit
Admission: RE | Admit: 2016-11-17 | Discharge: 2016-11-17 | Disposition: A | Discharge status: Home / Self Care | Payer: Commercial Managed Care - PPO | Source: Ambulatory Visit | Attending: Family Medicine | Admitting: Family Medicine

## 2016-11-17 DIAGNOSIS — Z1231 Encounter for screening mammogram for malignant neoplasm of breast: Secondary | ICD-10-CM | POA: Insufficient documentation

## 2016-12-10 ENCOUNTER — Other Ambulatory Visit: Payer: Self-pay | Admitting: Sports Medicine

## 2018-07-10 HISTORY — PX: DG  BONE DENSITY (ARMC HX): HXRAD1102

## 2019-07-11 HISTORY — PX: DIAGNOSTIC MAMMOGRAM: HXRAD719

## 2019-07-11 HISTORY — PX: REPLACEMENT TOTAL KNEE: SUR1224

## 2021-05-26 ENCOUNTER — Ambulatory Visit
Admission: EM | Admit: 2021-05-26 | Discharge: 2021-05-26 | Disposition: A | Discharge status: Home / Self Care | Payer: 59

## 2021-05-26 ENCOUNTER — Encounter: Payer: Self-pay | Admitting: Emergency Medicine

## 2021-05-26 ENCOUNTER — Other Ambulatory Visit: Payer: Self-pay

## 2021-05-26 ENCOUNTER — Ambulatory Visit (INDEPENDENT_AMBULATORY_CARE_PROVIDER_SITE_OTHER): Payer: 59

## 2021-05-26 ENCOUNTER — Ambulatory Visit
Admission: EM | Admit: 2021-05-26 | Discharge: 2021-05-26 | Disposition: A | Discharge status: Home / Self Care | Payer: 59 | Attending: Emergency Medicine | Admitting: Emergency Medicine

## 2021-05-26 DIAGNOSIS — J22 Unspecified acute lower respiratory infection: Secondary | ICD-10-CM

## 2021-05-26 DIAGNOSIS — R059 Cough, unspecified: Secondary | ICD-10-CM

## 2021-05-26 MED ORDER — PREDNISONE 10 MG PO TABS: ORAL_TABLET | ORAL | 0 refills | Status: AC

## 2021-05-26 MED ORDER — DOXYCYCLINE HYCLATE 100 MG PO CAPS: 100.0000 mg | ORAL_CAPSULE | Freq: Two times a day (BID) | ORAL | 0 refills | Status: DC

## 2021-05-26 NOTE — ED Triage Notes (Addendum)
Pt presents with a cough that she has had since September when she was dx with Covid. She developed a pain in her back today when coughing. Pt states it hurts to breath.

## 2021-05-26 NOTE — ED Provider Notes (Signed)
CHIEF COMPLAINT:   Chief Complaint  Patient presents with   Cough     SUBJECTIVE/HPI:  HPI A very pleasant 60 y.o.Female presents today with cough since September.  Patient reports that she was diagnosed with COVID in September.  Patient reports that she developed pain in her back today when coughing and reports that she is having discomfort with breathing. Patient does not report any shortness of breath, chest pain, palpitations, visual changes, weakness, tingling, headache, nausea, vomiting, diarrhea, fever, chills.   has a past medical history of Acute pancreatitis (04/2011), Arthritis, Asthma, Complication of anesthesia, GERD (gastroesophageal reflux disease), H/O hiatal hernia, Headache, History of depression (2008), History of pneumonia (1997), Hyperlipidemia, Hypertension, Hypothyroidism, Noncompliance, Numbness and tingling in left arm, Obesity, Palpitations, and PONV (postoperative nausea and vomiting).  ROS:  Review of Systems See Subjective/HPI Medications, Allergies and Problem List personally reviewed in Epic today OBJECTIVE:   Vitals:   05/26/21 1842  BP: (!) 197/98  Pulse: 82  Temp: 98.3 F (36.8 C)  SpO2: 98%    Physical Exam   General: Appears well-developed and well-nourished. No acute distress.  HEENT Head: Normocephalic and atraumatic. Ears: Hearing grossly intact, no drainage or visible deformity.  Nose: No nasal deviation.   Mouth/Throat: No stridor or tracheal deviation.  Eyes: Conjunctivae and EOM are normal. No eye drainage or scleral icterus bilaterally.  Neck: Normal range of motion, neck is supple.  Cardiovascular: Normal rate. Regular rhythm; no murmurs, gallops, or rubs.  Pulm/Chest: No respiratory distress.  Mild inspiratory wheezing noted over bilateral upper and lower lung fields. Mild rhonchi to bilateral lower lung fields..  No rales noted. Neurological: Alert and oriented to person, place, and time.  Skin: Skin is warm and dry.  No rashes,  lesions, abrasions or bruising noted to skin.   Psychiatric: Normal mood, affect, behavior, and thought content.   Vital signs and nursing note reviewed.   Patient stable and cooperative with examination. PROCEDURES:    LABS/X-RAYS/EKG/MEDS:   No results found for any visits on 05/26/21.  MEDICAL DECISION MAKING:   Patient presents with cough since September.  Patient reports that she was diagnosed with COVID in September.  Patient reports that she developed pain in her back today when coughing and reports that she is having discomfort with breathing. Patient does not report any shortness of breath, chest pain, palpitations, visual changes, weakness, tingling, headache, nausea, vomiting, diarrhea, fever, chills.  Chest x-ray reveals patchy reticular opacities to right lower lung field and some bronchial thickening.  No pneumothorax.  As read by me, overread pending.  ECG shows: NSR with no acute ST or T wave changes noted.  Given symptoms along with assessment findings, likely lower respiratory tract infection.  Rx doxycycline and prednisone to the patient's preferred pharmacy and advised about home treatment and care to include rest, fluids, Mucinex.  Return for new onset fever, difficulty breathing, chest pain, symptoms lasting longer than 3 to 4 weeks or bloody sputum.  Patient verbalized understanding and agreed with treatment plan.  Patient stable upon discharge. ASSESSMENT/PLAN:  1. Lower respiratory tract infection - doxycycline (VIBRAMYCIN) 100 MG capsule; Take 1 capsule (100 mg total) by mouth 2 (two) times daily.  Dispense: 20 capsule; Refill: 0 - predniSONE (DELTASONE) 10 MG tablet; Take 6 tablets (60 mg total) by mouth daily for 1 day, THEN 5 tablets (50 mg total) daily for 1 day, THEN 4 tablets (40 mg total) daily for 1 day, THEN 3 tablets (30 mg total) daily  for 1 day, THEN 2 tablets (20 mg total) daily for 1 day, THEN 1 tablet (10 mg total) daily for 1 day.  Dispense: 21 tablet;  Refill: 0 Instructions about new medications and side effects provided.  Plan:   Discharge Instructions      Take Doxycycline and Prednisone as prescribed  Rest, push lots of fluids (especially water), and utilize supportive care for symptoms. You may take take acetaminophen (Tylenol) every 4-6 hours or ibuprofen every 6-8 hours for muscle pain, joint pain, headaches. Mucinex (guaifenesin) may be taken over the counter for cough as needed and can loosen phlegm. Please read the instructions and take as directed. Saline nasal sprays to rinse congestion can help as well. Warm tea with lemon and honey can sooth sore throat and cough, as can cough drops.  Return to clinic for new-onset fever, difficulty breathing, chest pain, symptoms lasting >3 to 4 weeks, or bloody sputum.          Amalia Greenhouse, FNP 05/26/21 1935

## 2021-05-26 NOTE — Discharge Instructions (Addendum)
Take Doxycycline and Prednisone as prescribed  Rest, push lots of fluids (especially water), and utilize supportive care for symptoms. You may take take acetaminophen (Tylenol) every 4-6 hours or ibuprofen every 6-8 hours for muscle pain, joint pain, headaches. Mucinex (guaifenesin) may be taken over the counter for cough as needed and can loosen phlegm. Please read the instructions and take as directed. Saline nasal sprays to rinse congestion can help as well. Warm tea with lemon and honey can sooth sore throat and cough, as can cough drops.  Return to clinic for new-onset fever, difficulty breathing, chest pain, symptoms lasting >3 to 4 weeks, or bloody sputum.

## 2021-06-29 ENCOUNTER — Telehealth: Payer: 59 | Admitting: Physician Assistant

## 2021-06-29 DIAGNOSIS — U071 COVID-19: Secondary | ICD-10-CM | POA: Diagnosis not present

## 2021-06-29 DIAGNOSIS — M109 Gout, unspecified: Secondary | ICD-10-CM

## 2021-06-29 MED ORDER — COLCHICINE 0.6 MG PO TABS: ORAL_TABLET | ORAL | 0 refills | Status: DC

## 2021-06-29 MED ORDER — BENZONATATE 100 MG PO CAPS: 100.0000 mg | ORAL_CAPSULE | Freq: Three times a day (TID) | ORAL | 0 refills | Status: DC | PRN

## 2021-06-29 MED ORDER — MOLNUPIRAVIR EUA 200MG CAPSULE: 4.0000 | ORAL_CAPSULE | Freq: Two times a day (BID) | ORAL | 0 refills | Status: AC

## 2021-06-29 NOTE — Progress Notes (Signed)
Virtual Visit Consent   Joan Salazar, you are scheduled for a virtual visit with a [facility] provider today.     Just as with appointments in the office, your consent must be obtained to participate.  Your consent will be active for this visit and any virtual visit you may have with one of our providers in the next 365 days.     If you have a MyChart account, a copy of this consent can be sent to you electronically.  All virtual visits are billed to your insurance company just like a traditional visit in the office.    As this is a virtual visit, video technology does not allow for your provider to perform a traditional examination.  This may limit your provider's ability to fully assess your condition.  If your provider identifies any concerns that need to be evaluated in person or the need to arrange testing (such as labs, EKG, etc.), we will make arrangements to do so.     Although advances in technology are sophisticated, we cannot ensure that it will always work on either your end or our end.  If the connection with a video visit is poor, the visit may have to be switched to a telephone visit.  With either a video or telephone visit, we are not always able to ensure that we have a secure connection.     I need to obtain your verbal consent now.   Are you willing to proceed with your visit today?    Joan Salazar has provided verbal consent on 06/29/2021 for a virtual visit (video or telephone).   Piedad Climes, [place]   Date: 06/29/2021 4:47 PM   Virtual Visit via Video Note   I, Piedad Climes, connected with  Joan Salazar  ([id number], 04-23-1961) on 06/29/21 at  5:45 PM EST by a video-enabled telemedicine application and verified that I am speaking with the correct person using two identifiers.  Location: Patient: Virtual Visit Location Patient: Home Provider: Virtual Visit Location Provider: Home Office   I discussed the limitations of evaluation and management by  telemedicine and the availability of in person appointments. The patient expressed understanding and agreed to proceed.    History of Present Illness: Joan Salazar is a 60 y.o. who identifies as a female who was assigned female at birth, and is being seen today for COVID-19. Endorses symptoms starting yesterday with cough, congestion, body aches, fatigue. Took aa COVID test yesterday which was negative. Symptoms worsened overnight and on repeat testing today she was COVID positive. Denies any chest pain or SOB. Has pulse ox at home which is staying > 93%. Is unsure of who she may have gotten this from. Has been taking Dayquil, Tylenol and Benadryl.   Patient also noting 1.5 day flare of gout of her R elbow, an issue she has had many times in the past. Has not had issue in some time. Now with pain and warmth of joint. Notes normal ROM. Denies any trauma or injury. Usually takes colchicine for flares but has been out.   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   Degenerative disc disease, cervical 10/15/2014   Right foot pain 12/04/2013   Abdominal pain 05/16/2011   Acute pancreatitis 05/04/2011    Class: Hospitalized for   Abnormal LFTs 05/04/2011   Diarrhea 05/04/2011   HYPERLIPIDEMIA-MIXED 06/16/2009  HYPERTENSION, MALIGNANT, UNCONTROLLED 06/16/2009   FATIGUE / MALAISE 06/16/2009   PALPITATIONS 06/16/2009    Allergies:  Allergies  Allergen Reactions   Percocet [Oxycodone-Acetaminophen] Itching    Terrible itching   Codeine Itching    Bad itching   Medications:  Current Outpatient Medications:    benzonatate (TESSALON) 100 MG capsule, Take 1 capsule (100 mg total) by mouth 3 (three) times daily as needed for cough., Disp: 30 capsule, Rfl: 0   colchicine 0.6 MG tablet, Take 2 tablets by mouth x 1 at onset of gout flare. You can take another tablet 1 hour  later. Starting Day 2, you can take 1 tablet twice daily for a couple of days until resolution, Disp: 10 tablet, Rfl: 0   metoprolol succinate (TOPROL-XL) 50 MG 24 hr tablet, Take 1 tablet by mouth daily., Disp: , Rfl:    molnupiravir EUA (LAGEVRIO) 200 mg CAPS capsule, Take 4 capsules (800 mg total) by mouth 2 (two) times daily for 5 days., Disp: 40 capsule, Rfl: 0   ergocalciferol (VITAMIN D2) 1.25 MG (50000 UT) capsule, TAKE 1 CAPSULE BY MOUTH TWICE A WEEK., Disp: , Rfl:    hydrochlorothiazide (HYDRODIURIL) 25 MG tablet, Take 25 mg by mouth daily., Disp: , Rfl:    ipratropium-albuterol (DUONEB) 0.5-2.5 (3) MG/3ML SOLN, Inhale into the lungs., Disp: , Rfl:    losartan (COZAAR) 100 MG tablet, Take 1 tablet by mouth daily., Disp: , Rfl:    losartan (COZAAR) 50 MG tablet, Take 50 mg by mouth daily. , Disp: , Rfl:    magnesium oxide (MAG-OX) 400 MG tablet, Take by mouth., Disp: , Rfl:    pantoprazole (PROTONIX) 40 MG tablet, Take 1 tablet (40 mg total) by mouth 2 (two) times daily., Disp: 60 tablet, Rfl: 0   VENTOLIN HFA 108 (90 BASE) MCG/ACT inhaler, Inhale 1 puff into the lungs every 4 (four) hours as needed. Shortness of breath, Disp: , Rfl:   Observations/Objective: Patient is well-developed, well-nourished in no acute distress.  Resting comfortably at home.  Head is normocephalic, atraumatic.  No labored breathing. Speech is clear and coherent with logical content.  Patient is alert and oriented at baseline.   Assessment and Plan: 1. COVID-19 - benzonatate (TESSALON) 100 MG capsule; Take 1 capsule (100 mg total) by mouth 3 (three) times daily as needed for cough.  Dispense: 30 capsule; Refill: 0 - molnupiravir EUA (LAGEVRIO) 200 mg CAPS capsule; Take 4 capsules (800 mg total) by mouth 2 (two) times daily for 5 days.  Dispense: 40 capsule; Refill: 0 - MyChart COVID-19 home monitoring program; Future   Patient with multiple risk factors for complicated course of illness. Discussed  risks/benefits of antiviral medications including most common potential ADRs. Patient voiced understanding and would like to proceed with antiviral medication. They are candidate for molnupiravir. Rx sent to pharmacy. Supportive measures, OTC medications and vitamin regimen reviewed. Tessalon per orders. Patient has been enrolled in a MyChart COVID symptom monitoring program. Anne Shutter reviewed in detail. Strict ER precautions discussed with patient.   2. Acute gout of right elbow, unspecified cause - colchicine 0.6 MG tablet; Take 2 tablets by mouth x 1 at onset of gout flare. You can take another tablet 1 hour later. Starting Day 2, you can take 1 tablet twice daily for a couple of days until resolution  Dispense: 10 tablet; Refill: 0  Will give one-time script for gout flare. Rx colchicine to use as directed. Supportive measures and OTC medications reviewed. In-person follow-up for  any non-resolving, new or worsening symptoms.   Follow Up Instructions: I discussed the assessment and treatment plan with the patient. The patient was provided an opportunity to ask questions and all were answered. The patient agreed with the plan and demonstrated an understanding of the instructions.  A copy of instructions were sent to the patient via MyChart unless otherwise noted below.   The patient was advised to call back or seek an in-person evaluation if the symptoms worsen or if the condition fails to improve as anticipated.  Time:  I spent 10 minutes with the patient via telehealth technology discussing the above problems/concerns.    Piedad Climes, PA-C

## 2021-06-29 NOTE — Patient Instructions (Signed)
Joan Salazar, thank you for joining Piedad Climes, PA-C for today's virtual visit.  While this provider is not your primary care provider (PCP), if your PCP is located in our provider database this encounter information will be shared with them immediately following your visit.  Consent: (Patient) Joan Salazar provided verbal consent for this virtual visit at the beginning of the encounter.  Current Medications:  Current Outpatient Medications:    metoprolol succinate (TOPROL-XL) 50 MG 24 hr tablet, Take 1 tablet by mouth daily., Disp: , Rfl:    ergocalciferol (VITAMIN D2) 1.25 MG (50000 UT) capsule, TAKE 1 CAPSULE BY MOUTH TWICE A WEEK., Disp: , Rfl:    hydrochlorothiazide (HYDRODIURIL) 25 MG tablet, Take 25 mg by mouth daily., Disp: , Rfl:    ipratropium-albuterol (DUONEB) 0.5-2.5 (3) MG/3ML SOLN, Inhale into the lungs., Disp: , Rfl:    losartan (COZAAR) 100 MG tablet, Take 1 tablet by mouth daily., Disp: , Rfl:    losartan (COZAAR) 50 MG tablet, Take 50 mg by mouth daily. , Disp: , Rfl:    magnesium oxide (MAG-OX) 400 MG tablet, Take by mouth., Disp: , Rfl:    pantoprazole (PROTONIX) 40 MG tablet, Take 1 tablet (40 mg total) by mouth 2 (two) times daily., Disp: 60 tablet, Rfl: 0   VENTOLIN HFA 108 (90 BASE) MCG/ACT inhaler, Inhale 1 puff into the lungs every 4 (four) hours as needed. Shortness of breath, Disp: , Rfl:    Medications ordered in this encounter:  No orders of the defined types were placed in this encounter.    *If you need refills on other medications prior to your next appointment, please contact your pharmacy*  Follow-Up: Call back or seek an in-person evaluation if the symptoms worsen or if the condition fails to improve as anticipated.  Other Instructions Please keep well-hydrated and get plenty of rest. Start a saline nasal rinse to flush out your nasal passages. You can use plain Mucinex to help thin congestion. If you have a humidifier, running in the  bedroom at night. I want you to start OTC vitamin D3 1000 units daily, vitamin C 1000 mg daily, and a zinc supplement. Please take prescribed medications as directed.  You have been enrolled in a MyChart symptom monitoring program. Please answer these questions daily so we can keep track of how you are doing.  You were to quarantine for 5 days from onset of your symptoms.  After day 5, if you have had no fever and you are feeling better, you can end quarantine but need to mask for an additional 5 days. After day 5 if you have a fever or are having significant symptoms, please quarantine for full 10 days.  If you note any worsening of symptoms, any significant shortness of breath or any chest pain, please seek ER evaluation ASAP.  Please do not delay care!  COVID-19: What to Do if You Are Sick If you test positive and are an older adult or someone who is at high risk of getting very sick from COVID-19, treatment may be available. Contact a healthcare provider right away after a positive test to determine if you are eligible, even if your symptoms are mild right now. You can also visit a Test to Treat location and, if eligible, receive a prescription from a provider. Don't delay: Treatment must be started within the first few days to be effective. If you have a fever, cough, or other symptoms, you might have COVID-19. Most people have mild  illness and are able to recover at home. If you are sick: Keep track of your symptoms. If you have an emergency warning sign (including trouble breathing), call 911. Steps to help prevent the spread of COVID-19 if you are sick If you are sick with COVID-19 or think you might have COVID-19, follow the steps below to care for yourself and to help protect other people in your home and community. Stay home except to get medical care Stay home. Most people with COVID-19 have mild illness and can recover at home without medical care. Do not leave your home, except to  get medical care. Do not visit public areas and do not go to places where you are unable to wear a mask. Take care of yourself. Get rest and stay hydrated. Take over-the-counter medicines, such as acetaminophen, to help you feel better. Stay in touch with your doctor. Call before you get medical care. Be sure to get care if you have trouble breathing, or have any other emergency warning signs, or if you think it is an emergency. Avoid public transportation, ride-sharing, or taxis if possible. Get tested If you have symptoms of COVID-19, get tested. While waiting for test results, stay away from others, including staying apart from those living in your household. Get tested as soon as possible after your symptoms start. Treatments may be available for people with COVID-19 who are at risk for becoming very sick. Don't delay: Treatment must be started early to be effective--some treatments must begin within 5 days of your first symptoms. Contact your healthcare provider right away if your test result is positive to determine if you are eligible. Self-tests are one of several options for testing for the virus that causes COVID-19 and may be more convenient than laboratory-based tests and point-of-care tests. Ask your healthcare provider or your local health department if you need help interpreting your test results. You can visit your state, tribal, local, and territorial health department's website to look for the latest local information on testing sites. Separate yourself from other people As much as possible, stay in a specific room and away from other people and pets in your home. If possible, you should use a separate bathroom. If you need to be around other people or animals in or outside of the home, wear a well-fitting mask. Tell your close contacts that they may have been exposed to COVID-19. An infected person can spread COVID-19 starting 48 hours (or 2 days) before the person has any symptoms or  tests positive. By letting your close contacts know they may have been exposed to COVID-19, you are helping to protect everyone. See COVID-19 and Animals if you have questions about pets. If you are diagnosed with COVID-19, someone from the health department may call you. Answer the call to slow the spread. Monitor your symptoms Symptoms of COVID-19 include fever, cough, or other symptoms. Follow care instructions from your healthcare provider and local health department. Your local health authorities may give instructions on checking your symptoms and reporting information. When to seek emergency medical attention Look for emergency warning signs* for COVID-19. If someone is showing any of these signs, seek emergency medical care immediately: Trouble breathing Persistent pain or pressure in the chest New confusion Inability to wake or stay awake Pale, gray, or blue-colored skin, lips, or nail beds, depending on skin tone *This list is not all possible symptoms. Please call your medical provider for any other symptoms that are severe or concerning to you. Call  911 or call ahead to your local emergency facility: Notify the operator that you are seeking care for someone who has or may have COVID-19. Call ahead before visiting your doctor Call ahead. Many medical visits for routine care are being postponed or done by phone or telemedicine. If you have a medical appointment that cannot be postponed, call your doctor's office, and tell them you have or may have COVID-19. This will help the office protect themselves and other patients. If you are sick, wear a well-fitting mask You should wear a mask if you must be around other people or animals, including pets (even at home). Wear a mask with the best fit, protection, and comfort for you. You don't need to wear the mask if you are alone. If you can't put on a mask (because of trouble breathing, for example), cover your coughs and sneezes in some other  way. Try to stay at least 6 feet away from other people. This will help protect the people around you. Masks should not be placed on young children under age 75 years, anyone who has trouble breathing, or anyone who is not able to remove the mask without help. Cover your coughs and sneezes Cover your mouth and nose with a tissue when you cough or sneeze. Throw away used tissues in a lined trash can. Immediately wash your hands with soap and water for at least 20 seconds. If soap and water are not available, clean your hands with an alcohol-based hand sanitizer that contains at least 60% alcohol. Clean your hands often Wash your hands often with soap and water for at least 20 seconds. This is especially important after blowing your nose, coughing, or sneezing; going to the bathroom; and before eating or preparing food. Use hand sanitizer if soap and water are not available. Use an alcohol-based hand sanitizer with at least 60% alcohol, covering all surfaces of your hands and rubbing them together until they feel dry. Soap and water are the best option, especially if hands are visibly dirty. Avoid touching your eyes, nose, and mouth with unwashed hands. Handwashing Tips Avoid sharing personal household items Do not share dishes, drinking glasses, cups, eating utensils, towels, or bedding with other people in your home. Wash these items thoroughly after using them with soap and water or put in the dishwasher. Clean surfaces in your home regularly Clean and disinfect high-touch surfaces (for example, doorknobs, tables, handles, light switches, and countertops) in your "sick room" and bathroom. In shared spaces, you should clean and disinfect surfaces and items after each use by the person who is ill. If you are sick and cannot clean, a caregiver or other person should only clean and disinfect the area around you (such as your bedroom and bathroom) on an as needed basis. Your caregiver/other person should  wait as long as possible (at least several hours) and wear a mask before entering, cleaning, and disinfecting shared spaces that you use. Clean and disinfect areas that may have blood, stool, or body fluids on them. Use household cleaners and disinfectants. Clean visible dirty surfaces with household cleaners containing soap or detergent. Then, use a household disinfectant. Use a product from [employer] List N: Disinfectants for Coronavirus (COVID-19). Be sure to follow the instructions on the label to ensure safe and effective use of the product. Many products recommend keeping the surface wet with a disinfectant for a certain period of time (look at "contact time" on the product label). You may also need to wear personal  protective equipment, such as gloves, depending on the directions on the product label. Immediately after disinfecting, wash your hands with soap and water for 20 seconds. For completed guidance on cleaning and disinfecting your home, visit Complete Disinfection Guidance. Take steps to improve ventilation at home Improve ventilation (air flow) at home to help prevent from spreading COVID-19 to other people in your household. Clear out COVID-19 virus particles in the air by opening windows, using air filters, and turning on fans in your home. Use this interactive tool to learn how to improve air flow in your home. When you can be around others after being sick with COVID-19 Deciding when you can be around others is different for different situations. Find out when you can safely end home isolation. For any additional questions about your care, contact your healthcare provider or state or local health department. 09/28/2020 Content source: [facility] for Immunization and Respiratory Diseases ([facility]), Division of Viral Diseases This information is not intended to replace advice given to you by your health care provider. Make sure you discuss any questions you have with your health  care provider. Document Revised: 11/11/2020 Document Reviewed: 11/11/2020 Elsevier Patient Education  2022 [employer].      If you have been instructed to have an in-person evaluation today at a local Urgent Care facility, please use the link below. It will take you to a list of all of our available [facility], including address, phone number and hours of operation. Please do not delay care.  [facility]  If you or a family member do not have a primary care provider, use the link below to schedule a visit and establish care. When you choose a [facility] primary care physician or advanced practice provider, you gain a long-term partner in health. Find a Primary Care Provider  Learn more about [facility]'s in-office and virtual care options: [facility] - Get Care Now

## 2021-09-06 ENCOUNTER — Ambulatory Visit: Payer: Self-pay | Admitting: Family

## 2021-09-06 ENCOUNTER — Other Ambulatory Visit: Payer: Self-pay

## 2021-09-06 ENCOUNTER — Encounter: Payer: Self-pay | Admitting: Family

## 2021-09-06 ENCOUNTER — Ambulatory Visit (INDEPENDENT_AMBULATORY_CARE_PROVIDER_SITE_OTHER): Payer: 59 | Admitting: Family

## 2021-09-06 VITALS — BP 144/80 | HR 71 | Temp 98.1°F | Resp 16 | Ht 63.0 in | Wt 235.6 lb

## 2021-09-06 DIAGNOSIS — I700 Atherosclerosis of aorta: Secondary | ICD-10-CM

## 2021-09-06 DIAGNOSIS — Z1231 Encounter for screening mammogram for malignant neoplasm of breast: Secondary | ICD-10-CM

## 2021-09-06 DIAGNOSIS — I10 Essential (primary) hypertension: Secondary | ICD-10-CM

## 2021-09-06 DIAGNOSIS — E559 Vitamin D deficiency, unspecified: Secondary | ICD-10-CM

## 2021-09-06 DIAGNOSIS — Z1211 Encounter for screening for malignant neoplasm of colon: Secondary | ICD-10-CM

## 2021-09-06 DIAGNOSIS — Z7689 Persons encountering health services in other specified circumstances: Secondary | ICD-10-CM | POA: Diagnosis not present

## 2021-09-06 DIAGNOSIS — F418 Other specified anxiety disorders: Secondary | ICD-10-CM

## 2021-09-06 DIAGNOSIS — E782 Mixed hyperlipidemia: Secondary | ICD-10-CM

## 2021-09-06 DIAGNOSIS — J4520 Mild intermittent asthma, uncomplicated: Secondary | ICD-10-CM

## 2021-09-06 DIAGNOSIS — Z113 Encounter for screening for infections with a predominantly sexual mode of transmission: Secondary | ICD-10-CM

## 2021-09-06 DIAGNOSIS — K219 Gastro-esophageal reflux disease without esophagitis: Secondary | ICD-10-CM

## 2021-09-06 DIAGNOSIS — Z1159 Encounter for screening for other viral diseases: Secondary | ICD-10-CM

## 2021-09-06 MED ORDER — SERTRALINE HCL 25 MG PO TABS: 25.0000 mg | ORAL_TABLET | Freq: Every day | ORAL | 3 refills | Status: DC

## 2021-09-06 MED ORDER — LOSARTAN POTASSIUM 100 MG PO TABS: 100.0000 mg | ORAL_TABLET | Freq: Every day | ORAL | 0 refills | Status: DC

## 2021-09-06 MED ORDER — METOPROLOL SUCCINATE ER 50 MG PO TB24: 75.0000 mg | ORAL_TABLET | Freq: Every day | ORAL | 0 refills | Status: DC

## 2021-09-06 MED ORDER — PANTOPRAZOLE SODIUM 40 MG PO TBEC: 40.0000 mg | DELAYED_RELEASE_TABLET | Freq: Every day | ORAL | 0 refills | Status: DC

## 2021-09-06 NOTE — Progress Notes (Signed)
Provider: Autum Benfer FNP-C   Luisa Hart, FNP  Patient Care Team: Luisa Hart, FNP as PCP - General (Family Medicine) Jena Gauss Gerrit Friends, MD (Gastroenterology)  Extended Emergency Contact Information Primary Emergency Contact: Dillard,Katrina Address: [address]          [facility] [place], [place] [address] [place] Home Phone: [phone] Relation: Sister  Code Status:  Full code  Goals of care: Advanced Directive information Advanced Directives 09/06/2021  Does Patient Have a Medical Advance Directive? No  Does patient want to make changes to medical advance directive? -  Would patient like information on creating a medical advance directive? No - Patient declined  Pre-existing out of facility DNR order (yellow form or pink MOST form) -     Chief Complaint  Patient presents with   Establish Care    New patient.    HPI:  Pt is a 61 y.o. female seen today establish care here at [facility] for medical management of chronic diseases.she has a medical history of Essential Hypertension,Hyperlipidemia,Asthma ,GERD,Depression and Anxiety,Degenerative disc disease cervical ,Osteoarthritis,Hiatal Hernia,Obesity among others.  Hypertension - No home log for evaluation.on losartan 100 mg daily, HCTZ 12.5 mg tablet daily and Metoprolol 50 mg tablet daily.B/p runs high at home 160/90 - 190's/90  Had EKG sometimes has palpitation.No palpitation today.  denies any headache,dizziness,vision changes,fatigue,chest tightness,chest pain or shortness of breath.     Asthma - on any inhaler has to use at least once a week.   GERD - Symptoms controlled on Protonix 40 mg tablet daily.Symptoms controlled. No latest H/H for evaluation.No tarry or black stool   Anxiety - on Buspar 5 mg tablet twice daily states not helping.still gets anxious about every day.also feels depressed does not feel like getting out of bed most days. Works two days per week.     Vitamin D deficiency - on Vitamin D 50,000 units twice per week   She does not drink alcohol or smoke cigarettes.No other use of illicit drugs.she does drink coffee.  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  She does not exercise.   Health Maintenance :  She is due for screening colonoscopy.reports no symptoms   Mammogram - reports no symptoms.Previous mammogram were normal    Also due for shingles and Tetanus vaccine.will send prescription to Pharmacy.made aware to get vaccine at there pharmacy.   Past Medical History:  Diagnosis Date   Acute pancreatitis 04/10/2011   ?secondary to HCTZ.    Anxiety    per new patient form   Arthritis    Asthma    Complication of anesthesia    pt. states that with previous surgery, she was difficult to get to sleep   GERD (gastroesophageal reflux disease)    H/O hiatal hernia    Headache    history of migraines   High cholesterol    Per new patient form   History of depression 07/10/2006   History of pneumonia 07/11/1995   Hyperlipidemia    stopped statins due to muscle pain   Hypertension    Hypothyroidism    pt. 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 Jeani Hawking. Per pt, colon polyps and due in five years for f/u.   DG  BONE DENSITY ([facility] HX)  2020   Per new patient form   DIAGNOSTIC MAMMOGRAM  2021   Per new patient form   ESOPHAGOGASTRODUODENOSCOPY  1990s   duodenitis per patient    ESOPHAGOGASTRODUODENOSCOPY  05/24/2011   Procedure: ESOPHAGOGASTRODUODENOSCOPY (EGD);  Surgeon: Corbin Ade, MD;  Location: [facility];  Service: Endoscopy;  Laterality: N/A;  1:05   REPLACEMENT TOTAL KNEE Left 2021   Per new patient form   TONSILLECTOMY  1984   TOTAL ABDOMINAL HYSTERECTOMY  1991   TRIGGER FINGER RELEASE Right 2015   thumb    Allergies  Allergen Reactions   Percocet [Oxycodone-Acetaminophen] Itching    Terrible itching   Codeine Itching    Bad itching    Allergies as of 09/06/2021       Reactions   Percocet [oxycodone-acetaminophen] Itching   Terrible itching   Codeine Itching   Bad itching        Medication List        Accurate as of September 06, 2021  2:17 PM. If you have any questions, ask your nurse or doctor.          albuterol 108 (90 Base) MCG/ACT inhaler Commonly known as: VENTOLIN HFA Inhale 2 puffs into the lungs every 6 (six) hours as needed for wheezing or shortness of breath.   b complex vitamins capsule Take 1 capsule by mouth daily.   busPIRone 5 MG tablet Commonly known as: BUSPAR Take 1 tablet by mouth 2 (two) times daily.   DUONEB IN Inhale 1 Inhaler into the lungs.   ergocalciferol 1.25 MG (50000 UT) capsule Commonly known as: VITAMIN D2 TAKE 1 CAPSULE BY MOUTH TWICE A WEEK.   FISH OIL PO Take by mouth.   losartan 100 MG tablet Commonly known as: COZAAR Take 1 tablet (100 mg total) by mouth daily.   metoprolol succinate 50 MG 24 hr tablet Commonly known as: TOPROL-XL Take 1.5 tablets (75 mg total) by mouth daily. What changed: how much to take Changed by: Donalee Citrin Burhanuddin Kohlmann, NP   pantoprazole 40 MG tablet Commonly known as: PROTONIX Take 1 tablet (40 mg total) by mouth daily. What changed: Another medication with the same name was removed. Continue taking this medication, and follow the directions you see here. Changed by: Caesar Bookman, NP   sertraline 25 MG tablet Commonly known as: ZOLOFT Take 1  tablet (25 mg total) by mouth daily. Started by: Caesar Bookman, NP        Review of Systems  Constitutional:  Positive for fatigue. Negative for appetite change, chills, fever and unexpected weight change.  HENT:  Positive for sinus pressure. Negative for congestion, dental problem, ear discharge, ear pain, facial swelling, hearing loss, nosebleeds, postnasal drip, rhinorrhea, sinus pain, sneezing, sore throat, tinnitus and trouble swallowing.        Allergies   Eyes:  Positive for visual disturbance. Negative for pain, discharge, redness and itching.       Wears corrective lens follows up with [facility]   Respiratory:  Negative for cough, chest tightness, shortness of breath and wheezing.        Asthma  Cardiovascular:  Negative for chest pain, palpitations and leg swelling.  Gastrointestinal:  Positive for constipation and diarrhea. Negative for abdominal distention, abdominal pain, blood in stool, nausea and vomiting.       IBS  Hemorrhoids no bleeding   Endocrine: Negative for cold intolerance, heat intolerance, polydipsia, polyphagia and polyuria.  Genitourinary:  Negative for difficulty urinating, dysuria, flank pain, frequency and urgency.  Musculoskeletal:  Positive for arthralgias and back pain. Negative for gait problem, joint swelling, myalgias, neck pain and neck stiffness.       Osteoarthritis   Skin:  Negative for color change, pallor, rash and wound.  Neurological:  Negative for dizziness, syncope, speech difficulty, weakness, light-headedness, numbness and headaches.  Hematological:  Does not bruise/bleed easily.  Psychiatric/Behavioral:  Positive for sleep disturbance. Negative for agitation, behavioral problems, confusion and hallucinations. The patient is nervous/anxious.    Immunization History  Administered Date(s) Administered   Influenza Split 04/10/2016, 04/18/2018   Influenza-Unspecified 04/24/2017, 04/15/2019, 04/29/2020, 04/28/2021   Moderna  Sars-Covid-2 Vaccination 09/25/2019, 10/23/2019   Pfizer Covid-19 Vaccine Bivalent Booster 28yrs & up 11/30/2020   Tdap 07/11/2011   Pertinent  Health Maintenance Due  Topic Date Due   PAP SMEAR-Modifier  Never done   COLONOSCOPY (Pts 45-64yrs Insurance coverage will need to be confirmed)  Never done   MAMMOGRAM  11/18/2018   INFLUENZA VACCINE  Completed   Fall Risk 05/26/2021 09/06/2021  Falls in the past year? - 0  Was there an injury with Fall? - 0  Fall Risk Category Calculator - 0  Fall Risk Category - Low  Patient Fall Risk Level Low fall risk Low fall risk  Patient at Risk for Falls Due to - No Fall Risks  Fall risk Follow up - Falls evaluation completed   Functional Status Survey:    Vitals:   09/06/21 1314 09/06/21 1415  BP: (!) 160/90 (!) 144/80  Pulse: 71   Resp: 16   Temp: 98.1 F (36.7 C)   SpO2: 97%   Weight: 235 lb 9.6 oz (106.9 kg)   Height: $Remove'5\' 3"'OMEYqbP$  (1.6 m)    Body mass index is 41.73 kg/m. Physical Exam Vitals reviewed.  Constitutional:      General: She is not in acute distress.    Appearance: Normal appearance. She is obese. She is not ill-appearing or diaphoretic.  HENT:     Head: Normocephalic.     Right Ear: There is impacted cerumen.     Left Ear: There is impacted cerumen.     Nose: Nose normal. No congestion or rhinorrhea.     Mouth/Throat:     Mouth: Mucous membranes are moist.     Pharynx: Oropharynx is clear. No oropharyngeal exudate or posterior oropharyngeal erythema.  Eyes:     General: No scleral icterus.       Right eye: No discharge.        Left eye: No discharge.     Extraocular Movements: Extraocular movements intact.     Conjunctiva/sclera: Conjunctivae normal.     Pupils: Pupils are equal, round, and reactive to light.  Neck:     Vascular: No carotid bruit.  Cardiovascular:     Rate and Rhythm: Normal rate and regular rhythm.     Pulses: Normal pulses.     Heart sounds: Normal heart sounds. No murmur heard.   No friction  rub. No gallop.  Pulmonary:     Effort: Pulmonary effort is normal. No respiratory distress.     Breath sounds:  Normal breath sounds. No wheezing, rhonchi or rales.  Chest:     Chest wall: No tenderness.  Abdominal:     General: Bowel sounds are normal. There is no distension.     Palpations: Abdomen is soft. There is no mass.     Tenderness: There is no abdominal tenderness. There is no right CVA tenderness, left CVA tenderness, guarding or rebound.  Musculoskeletal:        General: No swelling or tenderness. Normal range of motion.     Cervical back: Normal range of motion. No rigidity or tenderness.     Right lower leg: No edema.     Left lower leg: No edema.  Lymphadenopathy:     Cervical: No cervical adenopathy.  Skin:    General: Skin is warm and dry.     Coloration: Skin is not pale.     Findings: No bruising, erythema, lesion or rash.  Neurological:     Mental Status: She is alert and oriented to person, place, and time.     Cranial Nerves: No cranial nerve deficit.     Sensory: No sensory deficit.     Motor: No weakness.     Coordination: Coordination normal.     Gait: Gait normal.  Psychiatric:        Mood and Affect: Mood normal.        Speech: Speech normal.        Behavior: Behavior normal.        Thought Content: Thought content normal.        Judgment: Judgment normal.    Labs reviewed: No results for input(s): NA, K, CL, CO2, GLUCOSE, BUN, CREATININE, CALCIUM, MG, PHOS in the last 8760 hours. No results for input(s): AST, ALT, ALKPHOS, BILITOT, PROT, ALBUMIN in the last 8760 hours. No results for input(s): WBC, NEUTROABS, HGB, HCT, MCV, PLT in the last 8760 hours. Lab Results  Component Value Date   TSH 4.108 06/22/2009   No results found for: HGBA1C Lab Results  Component Value Date   CHOL 278 (H) 06/22/2009   HDL 57 06/22/2009   LDLCALC 194 (H) 06/22/2009   TRIG 137 06/22/2009   CHOLHDL 4.9 Ratio 06/22/2009    Significant Diagnostic Results in  last 30 days:  No results found.  Assessment/Plan 1. Encounter to establish care Available medical records reviewed.Recommended to schedule for fasting labs and pap smear.also advised to get her shingles vaccine at her pharmacy.will need to verify records for Tdap.   2. Essential hypertension B/p high today and reports high levels at home too. Will increase Metoprolol from 50 mg tablet to 75 mg daily.may use current medication 1.5 tablet until completed.  - CBC with Differential/Platelet; Future - CMP with eGFR([facility]); Future - TSH; Future - Lipid panel; Future - metoprolol succinate (TOPROL-XL) 50 MG 24 hr tablet; Take 1.5 tablets (75 mg total) by mouth daily.  Dispense: 90 tablet; Refill: 0 - losartan (COZAAR) 100 MG tablet; Take 1 tablet (100 mg total) by mouth daily.  Dispense: 90 tablet; Refill: 0  3. Mixed hyperlipidemia No recent LDL for review  - Lipid panel; Future  4. GERD without esophagitis Symptoms well controlled  No reports of tarry or black stool  Continue on Protonix  - pantoprazole (PROTONIX) 40 MG tablet; Take 1 tablet (40 mg total) by mouth daily.  Dispense: 90 tablet; Refill: 0  5. Mild intermittent asthma without complication Symptoms controlled  - continue on albuterol and Duonebs.  6. Depression with anxiety Reports  feeling depressed several days of the week with no energy,does not have anymore pleasure in things she used to love to do.Has trouble concentrating and sleeping.  - start on sertraline as below.side effects discussed.counseling service also recommended.  - sertraline (ZOLOFT) 25 MG tablet; Take 1 tablet (25 mg total) by mouth daily.  Dispense: 30 tablet; Refill: 3 - advised to follow up in 2 weeks for evaluation   7. Breast cancer screening by mammogram Asymptomatic  - MM DIGITAL SCREENING BILATERAL; Future  8. Colon cancer screening Asymptomatic - Ambulatory referral to Gastroenterology  9. Atherosclerosis of abdominal aorta (HCC) No  chest pain reported  - continue to control high risk factors. Will check lab work then discuss need to start on ASA and Statin   10. Vitamin D deficiency Continue on vitamin D supplement  - Vitamin D, 1,25-dihydroxy; Future  11. Encounter for hepatitis C screening test for low risk patient Low risk   12. Screen for STD (sexually transmitted disease) Reports no high risk behaviors - HIV Antibody (routine testing w rflx); Future  Family/ staff Communication: Reviewed plan of care with patient verbalized understanding   Labs/tests ordered:  - CBC with Differential/Platelet - CMP with eGFR([facility]) - TSH - Lipid panel - HIV Antibody (routine testing w rflx)  Next Appointment : 2 weeks for fasting and Depression   Shelma Eiben C Audrey Thull, NP

## 2021-09-06 NOTE — Patient Instructions (Signed)
-   check Blood pressure at home and record on log provided and notify provider if B/p > 140/90  ? ?

## 2021-09-13 NOTE — Telephone Encounter (Signed)
Okay to add Hydrochlorothiazide 12.5 mg tablet one by mouth daily to current medication.  ?

## 2021-09-13 NOTE — Telephone Encounter (Signed)
Is this ok to add to Patient's Current Medication list and send to pharmacy. Please Advise.  ?

## 2021-09-14 NOTE — Telephone Encounter (Signed)
When I tried to add medication as a tablet it only comes up as a capsule. RX is pending for Ngetich, Joan C, NP to review and advise  ?

## 2021-09-15 MED ORDER — HYDROCHLOROTHIAZIDE 12.5 MG PO CAPS: 12.5000 mg | ORAL_CAPSULE | Freq: Every day | ORAL | 1 refills | Status: DC

## 2021-09-21 ENCOUNTER — Other Ambulatory Visit: Payer: Self-pay

## 2021-09-21 ENCOUNTER — Ambulatory Visit (INDEPENDENT_AMBULATORY_CARE_PROVIDER_SITE_OTHER): Payer: Self-pay | Admitting: Family

## 2021-09-21 ENCOUNTER — Encounter: Payer: Self-pay | Admitting: Family

## 2021-09-21 VITALS — BP 160/98 | HR 66 | Temp 96.9°F | Resp 16 | Ht 63.0 in | Wt 232.6 lb

## 2021-09-21 DIAGNOSIS — I10 Essential (primary) hypertension: Secondary | ICD-10-CM

## 2021-09-21 DIAGNOSIS — F418 Other specified anxiety disorders: Secondary | ICD-10-CM

## 2021-09-21 DIAGNOSIS — E782 Mixed hyperlipidemia: Secondary | ICD-10-CM

## 2021-09-21 DIAGNOSIS — Z113 Encounter for screening for infections with a predominantly sexual mode of transmission: Secondary | ICD-10-CM

## 2021-09-21 DIAGNOSIS — E559 Vitamin D deficiency, unspecified: Secondary | ICD-10-CM

## 2021-09-21 MED ORDER — METOPROLOL SUCCINATE ER 100 MG PO TB24: 100.0000 mg | ORAL_TABLET | Freq: Every day | ORAL | 0 refills | Status: DC

## 2021-09-21 MED ORDER — SERTRALINE HCL 50 MG PO TABS: 50.0000 mg | ORAL_TABLET | Freq: Every day | ORAL | 0 refills | Status: DC

## 2021-09-21 MED ORDER — CLONIDINE HCL 0.1 MG PO TABS
0.1000 mg | ORAL_TABLET | Freq: Once | ORAL | Status: AC
  Administered 2021-09-21: 0.1 mg via ORAL

## 2021-09-21 NOTE — Patient Instructions (Addendum)
-Increase sertraline from 25 mg tablet to 50 mg tablet 1 by mouth daily for depression.  May use 2 tablets of the 25 until it is gone. ? ?-Increase metoprolol to succinate from 75 mg to 100 mg tablet 1 by mouth  daily.  May use 2 tablets of the 50 mg until its gone. ? ?-Check blood pressure twice daily and notify provider if it is still greater than 140/90 ? ?-Schedule appointment with counseling therapist for depression and anxiety ? ?DASH Eating Plan ?DASH stands for Dietary Approaches to Stop Hypertension. The DASH eating plan is a healthy eating plan that has been shown to: ?Reduce high blood pressure (hypertension). ?Reduce your risk for type 2 diabetes, heart disease, and stroke. ?Help with weight loss. ?What are tips for following this plan? ?Reading food labels ?Check food labels for the amount of salt (sodium) per serving. Choose foods with less than 5 percent of the Daily Value of sodium. Generally, foods with less than 300 milligrams (mg) of sodium per serving fit into this eating plan. ?To find whole grains, look for the word "whole" as the first word in the ingredient list. ?Shopping ?Buy products labeled as "low-sodium" or "no salt added." ?Buy fresh foods. Avoid canned foods and pre-made or frozen meals. ?Cooking ?Avoid adding salt when cooking. Use salt-free seasonings or herbs instead of table salt or sea salt. Check with your health care provider or pharmacist before using salt substitutes. ?Do not fry foods. Cook foods using healthy methods such as baking, boiling, grilling, roasting, and broiling instead. ?Cook with heart-healthy oils, such as olive, canola, avocado, soybean, or sunflower oil. ?Meal planning ? ?Eat a balanced diet that includes: ?4 or more servings of fruits and 4 or more servings of vegetables each day. Try to fill one-half of your plate with fruits and vegetables. ?6-8 servings of whole grains each day. ?Less than 6 oz (170 g) of lean meat, poultry, or fish each day. A 3-oz  (85-g) serving of meat is about the same size as a deck of cards. One egg equals 1 oz (28 g). ?2-3 servings of low-fat dairy each day. One serving is 1 cup (237 mL). ?1 serving of nuts, seeds, or beans 5 times each week. ?2-3 servings of heart-healthy fats. Healthy fats called omega-3 fatty acids are found in foods such as walnuts, flaxseeds, fortified milks, and eggs. These fats are also found in cold-water fish, such as sardines, salmon, and mackerel. ?Limit how much you eat of: ?Canned or prepackaged foods. ?Food that is high in trans fat, such as some fried foods. ?Food that is high in saturated fat, such as fatty meat. ?Desserts and other sweets, sugary drinks, and other foods with added sugar. ?Full-fat dairy products. ?Do not salt foods before eating. ?Do not eat more than 4 egg yolks a week. ?Try to eat at least 2 vegetarian meals a week. ?Eat more home-cooked food and less restaurant, buffet, and fast food. ?Lifestyle ?When eating at a restaurant, ask that your food be prepared with less salt or no salt, if possible. ?If you drink alcohol: ?Limit how much you use to: ?0-1 drink a day for women who are not pregnant. ?0-2 drinks a day for men. ?Be aware of how much alcohol is in your drink. In the [place] one drink equals one 12 oz bottle of beer (355 mL), one 5 oz glass of wine (148 mL), or one 1? oz glass of hard liquor (44 mL). ?General information ?Avoid eating more than  2,300 mg of salt a day. If you have hypertension, you may need to reduce your sodium intake to 1,500 mg a day. ?Work with your health care provider to maintain a healthy body weight or to lose weight. Ask what an ideal weight is for you. ?Get at least 30 minutes of exercise that causes your heart to beat faster (aerobic exercise) most days of the week. Activities may include walking, swimming, or biking. ?Work with your health care provider or dietitian to adjust your eating plan to your individual calorie needs. ?What foods should I  eat? ?Fruits ?All fresh, dried, or frozen fruit. Canned fruit in natural juice (without added sugar). ?Vegetables ?Fresh or frozen vegetables (raw, steamed, roasted, or grilled). Low-sodium or reduced-sodium tomato and vegetable juice. Low-sodium or reduced-sodium tomato sauce and tomato paste. Low-sodium or reduced-sodium canned vegetables. ?Grains ?Whole-grain or whole-wheat bread. Whole-grain or whole-wheat pasta. Brown rice. Modena Morrow. Bulgur. Whole-grain and low-sodium cereals. Pita bread. Low-fat, low-sodium crackers. Whole-wheat flour tortillas. ?Meats and other proteins ?Skinless chicken or [place]. Ground chicken or [place]. Pork with fat trimmed off. Fish and seafood. Egg whites. Dried beans, peas, or lentils. Unsalted nuts, nut butters, and seeds. Unsalted canned beans. Lean cuts of beef with fat trimmed off. Low-sodium, lean precooked or cured meat, such as sausages or meat loaves. ?Dairy ?Low-fat (1%) or fat-free (skim) milk. Reduced-fat, low-fat, or fat-free cheeses. Nonfat, low-sodium ricotta or cottage cheese. Low-fat or nonfat yogurt. Low-fat, low-sodium cheese. ?Fats and oils ?Soft margarine without trans fats. Vegetable oil. Reduced-fat, low-fat, or light mayonnaise and salad dressings (reduced-sodium). Canola, safflower, olive, avocado, soybean, and sunflower oils. Avocado. ?Seasonings and condiments ?Herbs. Spices. Seasoning mixes without salt. ?Other foods ?Unsalted popcorn and pretzels. Fat-free sweets. ?The items listed above may not be a complete list of foods and beverages you can eat. Contact a dietitian for more information. ?What foods should I avoid? ?Fruits ?Canned fruit in a light or heavy syrup. Fried fruit. Fruit in cream or butter sauce. ?Vegetables ?Creamed or fried vegetables. Vegetables in a cheese sauce. Regular canned vegetables (not low-sodium or reduced-sodium). Regular canned tomato sauce and paste (not low-sodium or reduced-sodium). Regular tomato and vegetable juice  (not low-sodium or reduced-sodium). Angie Fava. Olives. ?Grains ?Baked goods made with fat, such as croissants, muffins, or some breads. Dry pasta or rice meal packs. ?Meats and other proteins ?Fatty cuts of meat. Ribs. Fried meat. Berniece Salines. Bologna, salami, and other precooked or cured meats, such as sausages or meat loaves. Fat from the back of a pig (fatback). Bratwurst. Salted nuts and seeds. Canned beans with added salt. Canned or smoked fish. Whole eggs or egg yolks. Chicken or [place] with skin. ?Dairy ?Whole or 2% milk, cream, and half-and-half. Whole or full-fat cream cheese. Whole-fat or sweetened yogurt. Full-fat cheese. Nondairy creamers. Whipped toppings. Processed cheese and cheese spreads. ?Fats and oils ?Butter. Stick margarine. Lard. Shortening. Ghee. Bacon fat. Tropical oils, such as coconut, palm kernel, or palm oil. ?Seasonings and condiments ?Onion salt, garlic salt, seasoned salt, table salt, and sea salt. Worcestershire sauce. Tartar sauce. Barbecue sauce. Teriyaki sauce. Soy sauce, including reduced-sodium. Steak sauce. Canned and packaged gravies. Fish sauce. Oyster sauce. Cocktail sauce. Store-bought horseradish. Ketchup. Mustard. Meat flavorings and tenderizers. Bouillon cubes. Hot sauces. Pre-made or packaged marinades. Pre-made or packaged taco seasonings. Relishes. Regular salad dressings. ?Other foods ?Salted popcorn and pretzels. ?The items listed above may not be a complete list of foods and beverages you should avoid. Contact a dietitian for more information. ?Where to  find more information ?National Heart, Lung, and Blood Institute: https://wilson-eaton.com/ ?American Heart Association: www.heart.org ?Academy of Nutrition and Dietetics: www.eatright.org ?[place]: www.kidney.org ?Summary ?The DASH eating plan is a healthy eating plan that has been shown to reduce high blood pressure (hypertension). It may also reduce your risk for type 2 diabetes, heart disease, and  stroke. ?When on the DASH eating plan, aim to eat more fresh fruits and vegetables, whole grains, lean proteins, low-fat dairy, and heart-healthy fats. ?With the DASH eating plan, you should limit salt (sodium) intake to 2

## 2021-09-21 NOTE — Progress Notes (Signed)
Provider: Richarda Blade FNP-C  Kalaysia Demonbreun, Donalee Citrin, NP  Patient Care Team: Jerol Rufener, Donalee Citrin, NP as PCP - General (Family Medicine) Corbin Ade, MD (Gastroenterology)  Extended Emergency Contact Information Primary Emergency Contact: Dillard,Katrina Address: [address]          [place], [place] [address] [place] Home Phone: [phone] Relation: Sister  Code Status:  Full Code  Goals of care: Advanced Directive information Advanced Directives 09/21/2021  Does Patient Have a Medical Advance Directive? No  Does patient want to make changes to medical advance directive? -  Would patient like information on creating a medical advance directive? No - Patient declined  Pre-existing out of facility DNR order (yellow form or pink MOST form) -     Chief Complaint  Patient presents with   Follow-up    2 week follow up on depression.   Labs     Fasting Labs    HPI:  Pt is a 61 y.o. female seen today for an acute visit for 2 weeks follow up for depression.  She was seen on September 06, 2021 to establish care with me she reported her blood pressures at home were high was 160/90 190's/ 90.  Metroprolol was increased from 50 mg to 75 mg.  She brought in her blood pressure readings from home today readings ranges from 140's to 1/80 - 190's/90's.She is afraid that her mother had problems with high blood pressure and congestive heart failure. She states had some headaches.She can usually feel it when her blood pressure is high.denies any dizziness,vision changes,fatigue,chest tightness,palpitation,chest pain or shortness of breath.   She was also here for follow-up depression.  She was started on sertraline 25 mg on last visit. She is that she does not really feel like this done anything for home still is feeling depressed but has not was worsen.Discussed dose adjustment today.  Also states has right eye cataract that requires removal but has tried to book appointment  with ophthalmologist and there is no available appointment until July 2023.      Past Medical History:  Diagnosis Date   Acute pancreatitis 04/10/2011   ?secondary to HCTZ.    Anxiety    per new patient form   Arthritis    Asthma    Complication of anesthesia    pt. states that with previous surgery, she was difficult to get to sleep   GERD (gastroesophageal reflux disease)    H/O hiatal hernia    Headache    history of migraines   High cholesterol    Per new patient form   History of depression 07/10/2006   History of pneumonia 07/11/1995   Hyperlipidemia    stopped statins due to muscle pain   Hypertension    Hypothyroidism    pt. 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 Jeani Hawking. Per pt, colon polyps and due in five years for f/u.  DG  BONE DENSITY ([facility] HX)  2020   Per new patient form   DIAGNOSTIC MAMMOGRAM  2021   Per new patient form   ESOPHAGOGASTRODUODENOSCOPY  1990s   duodenitis per patient   ESOPHAGOGASTRODUODENOSCOPY  05/24/2011   Procedure: ESOPHAGOGASTRODUODENOSCOPY (EGD);  Surgeon: Corbin Ade, MD;  Location: [facility]O [facility];  Service: Endoscopy;  Laterality: N/A;  1:05   REPLACEMENT TOTAL KNEE Left 2021   Per new patient form   TONSILLECTOMY  1984   TOTAL ABDOMINAL HYSTERECTOMY  1991   TRIGGER FINGER RELEASE Right 2015   thumb    Allergies  Allergen Reactions   Percocet [Oxycodone-Acetaminophen] Itching    Terrible  itching   Codeine Itching    Bad itching    Outpatient Encounter Medications as of 09/21/2021  Medication Sig   albuterol (VENTOLIN HFA) 108 (90 Base) MCG/ACT inhaler Inhale 2 puffs into the lungs every 6 (six) hours as needed for wheezing or shortness of breath.   b complex vitamins capsule Take 1 capsule by mouth daily.   busPIRone (BUSPAR) 5 MG tablet Take 1 tablet by mouth 2 (two) times daily.   ergocalciferol (VITAMIN D2) 1.25 MG (50000 UT) capsule TAKE 1 CAPSULE BY MOUTH TWICE A WEEK.   hydrochlorothiazide (MICROZIDE) 12.5 MG capsule Take 1 capsule (12.5 mg total) by mouth daily.   Ipratropium-Albuterol (DUONEB IN) Inhale 1 Inhaler into the lungs.   losartan (COZAAR) 100 MG tablet Take 1 tablet (100 mg total) by mouth daily.   Omega-3 Fatty Acids (FISH OIL PO) Take by mouth.   pantoprazole (PROTONIX) 40 MG tablet Take 1 tablet (40 mg total) by mouth daily.   [DISCONTINUED] metoprolol succinate (TOPROL-XL) 50 MG 24 hr tablet Take 1.5 tablets (75 mg total) by mouth daily.   [DISCONTINUED] sertraline (ZOLOFT) 25 MG tablet Take 1 tablet (25 mg total) by mouth daily.   metoprolol succinate (TOPROL-XL) 100 MG 24 hr tablet Take 1 tablet (100 mg total) by mouth daily.   sertraline (ZOLOFT) 50 MG tablet Take 1 tablet (50 mg total) by mouth daily.   Facility-Administered Encounter Medications as of 09/21/2021  Medication   cloNIDine (CATAPRES) tablet 0.1 mg    Review of Systems  Constitutional:  Negative for appetite change, chills, fatigue, fever and unexpected weight change.  HENT:  Negative for congestion, ear discharge, ear pain, facial swelling, hearing loss, nosebleeds, postnasal drip, rhinorrhea, sinus pressure, sinus pain, sneezing and sore throat.   Eyes:  Positive for visual disturbance. Negative for pain, discharge, redness and itching.       Cataract   Respiratory:  Negative for cough, chest tightness, shortness of breath and wheezing.   Cardiovascular:  Negative for chest pain,  palpitations and leg swelling.  Gastrointestinal:  Negative for abdominal distention, abdominal pain, blood in stool, nausea and vomiting.       IBS with both constipation and diarrhea  Musculoskeletal:  Positive for arthralgias and back pain. Negative for gait problem, joint swelling, myalgias, neck pain and neck stiffness.  Skin:  Negative for color change, pallor and rash.  Neurological:  Negative for dizziness, syncope, speech difficulty, weakness, light-headedness and numbness.       Headache sometimes when blood pressure high   Psychiatric/Behavioral:  Positive for sleep disturbance. Negative for agitation, behavioral problems, confusion, hallucinations, self-injury and suicidal ideas. The patient is nervous/anxious.        Depression    Immunization History  Administered Date(s) Administered   Influenza Split 04/10/2016, 04/18/2018   Influenza-Unspecified 04/24/2017,  04/15/2019, 04/29/2020, 04/28/2021   Moderna Sars-Covid-2 Vaccination 09/25/2019, 10/23/2019   Pfizer Covid-19 Vaccine Bivalent Booster 67yrs & up 11/30/2020   Tdap 07/11/2011   Pertinent  Health Maintenance Due  Topic Date Due   PAP SMEAR-Modifier  Never done   COLONOSCOPY (Pts 45-84yrs Insurance coverage will need to be confirmed)  Never done   MAMMOGRAM  11/18/2018   INFLUENZA VACCINE  Completed   Fall Risk 05/26/2021 09/06/2021 09/21/2021  Falls in the past year? - 0 0  Was there an injury with Fall? - 0 0  Fall Risk Category Calculator - 0 0  Fall Risk Category - Low Low  Patient Fall Risk Level Low fall risk Low fall risk Low fall risk  Patient at Risk for Falls Due to - No Fall Risks No Fall Risks  Fall risk Follow up - Falls evaluation completed Falls evaluation completed   Functional Status Survey:    Vitals:   09/21/21 0821  BP: (!) 180/118  Pulse: 66  Resp: 16  Temp: (!) 96.9 F (36.1 C)  SpO2: 97%  Weight: 232 lb 9.6 oz (105.5 kg)  Height: 5\' 3"  (1.6 m)   Body mass index is 41.2  kg/m. Physical Exam Vitals reviewed.  Constitutional:      General: She is not in acute distress.    Appearance: Normal appearance. She is normal weight. She is not ill-appearing or diaphoretic.  HENT:     Head: Normocephalic.     Mouth/Throat:     Mouth: Mucous membranes are moist.     Pharynx: Oropharynx is clear. No oropharyngeal exudate or posterior oropharyngeal erythema.  Eyes:     General: No scleral icterus.       Right eye: No discharge.        Left eye: No discharge.     Conjunctiva/sclera: Conjunctivae normal.     Pupils: Pupils are equal, round, and reactive to light.  Neck:     Vascular: No carotid bruit.  Cardiovascular:     Rate and Rhythm: Normal rate and regular rhythm.     Pulses: Normal pulses.     Heart sounds: Normal heart sounds. No murmur heard.   No friction rub. No gallop.  Pulmonary:     Effort: Pulmonary effort is normal. No respiratory distress.     Breath sounds: Normal breath sounds. No wheezing, rhonchi or rales.  Chest:     Chest wall: No tenderness.  Abdominal:     General: Bowel sounds are normal. There is no distension.     Palpations: Abdomen is soft. There is no mass.     Tenderness: There is no abdominal tenderness. There is no right CVA tenderness, left CVA tenderness, guarding or rebound.  Musculoskeletal:        General: No swelling or tenderness. Normal range of motion.     Cervical back: Normal range of motion. No rigidity or tenderness.     Right lower leg: No edema.     Left lower leg: No edema.  Lymphadenopathy:     Cervical: No cervical adenopathy.  Skin:    General: Skin is warm and dry.     Coloration: Skin is not pale.     Findings: No erythema or rash.  Neurological:     Mental Status: She is alert and oriented to person, place, and time.     Motor: No weakness.     Gait: Gait normal.  Psychiatric:        Mood and Affect: Mood  is anxious and depressed.        Speech: Speech normal.        Behavior: Behavior normal.         Thought Content: Thought content normal.        Judgment: Judgment normal.    Labs reviewed: No results for input(s): NA, K, CL, CO2, GLUCOSE, BUN, CREATININE, CALCIUM, MG, PHOS in the last 8760 hours. No results for input(s): AST, ALT, ALKPHOS, BILITOT, PROT, ALBUMIN in the last 8760 hours. No results for input(s): WBC, NEUTROABS, HGB, HCT, MCV, PLT in the last 8760 hours. Lab Results  Component Value Date   TSH 4.108 06/22/2009   No results found for: HGBA1C Lab Results  Component Value Date   CHOL 278 (H) 06/22/2009   HDL 57 06/22/2009   LDLCALC 194 (H) 06/22/2009   TRIG 137 06/22/2009   CHOLHDL 4.9 Ratio 06/22/2009    Significant Diagnostic Results in last 30 days:  No results found.  Assessment/Plan  1. Essential hypertension Blood pressure is still uncontrolled though she did not take her blood pressure medication this morning.  Her home readings still not at goal. -We will continue with hydrochlorothiazide 12.5 mg tablet daily and losartan 100 mg daily. -Increase metoprolol succinate from 75mg  to 100 mg tablet daily.  Advised to use up to 50 mg tablet may take 2 until gone. -Advised to check blood pressure twice daily at home and notify provider if is still greater than 140/90. -We will increase hydrochlorothiazide to 25 mg if blood pressure still uncontrolled - metoprolol succinate (TOPROL-XL) 100 MG 24 hr tablet; Take 1 tablet (100 mg total) by mouth daily.  Dispense: 90 tablet; Refill: 0 - - cloNIDine (CATAPRES) tablet 0.1 mg  2. Depression with anxiety Still feeling depressed but symptoms have not worsened -Discussed to increase sertraline from 25 mg to 50 mg tablet 1 by mouth daily -Advised to notify provider if symptoms worsen - sertraline (ZOLOFT) 50 MG tablet; Take 1 tablet (50 mg total) by mouth daily.  Dispense: 90 tablet; Refill: 0   Family/ staff Communication: Reviewed plan of care with patient verbalized understanding  Labs/tests ordered: Has  lab orders ordered On previous visit  Next Appointment: 1 month for follow-up depression and high blood pressure  Chadwick Reiswig C Jeromey Kruer, NP

## 2021-09-27 ENCOUNTER — Telehealth (INDEPENDENT_AMBULATORY_CARE_PROVIDER_SITE_OTHER): Payer: 59 | Admitting: Family

## 2021-09-27 ENCOUNTER — Other Ambulatory Visit: Payer: Self-pay

## 2021-09-27 ENCOUNTER — Encounter: Payer: Self-pay | Admitting: Family

## 2021-09-27 DIAGNOSIS — R739 Hyperglycemia, unspecified: Secondary | ICD-10-CM

## 2021-09-27 DIAGNOSIS — E782 Mixed hyperlipidemia: Secondary | ICD-10-CM

## 2021-09-27 DIAGNOSIS — E876 Hypokalemia: Secondary | ICD-10-CM

## 2021-09-27 DIAGNOSIS — I10 Essential (primary) hypertension: Secondary | ICD-10-CM | POA: Diagnosis not present

## 2021-09-27 LAB — LIPID PANEL
Cholesterol: 286 mg/dL — ABNORMAL HIGH (ref <200)
HDL: 67 mg/dL (ref >=50)
LDL Cholesterol (Calc): 195 mg/dL (calc) — ABNORMAL HIGH
Non-HDL Cholesterol (Calc): 219 mg/dL (calc) — ABNORMAL HIGH (ref <130)
Total CHOL/HDL Ratio: 4.3 (calc) (ref <5.0)
Triglycerides: 117 mg/dL (ref <150)

## 2021-09-27 LAB — CBC WITH DIFFERENTIAL/PLATELET
Absolute Monocytes: 583 cells/uL (ref 200–950)
Basophils Absolute: 41 cells/uL (ref 0–200)
Basophils Relative: 0.5 %
Eosinophils Absolute: 194 cells/uL (ref 15–500)
Eosinophils Relative: 2.4 %
HCT: 40.2 % (ref 35.0–45.0)
Hemoglobin: 13.5 g/dL (ref 11.7–15.5)
Lymphs Abs: 1912 cells/uL (ref 850–3900)
MCH: 31.4 pg (ref 27.0–33.0)
MCHC: 33.6 g/dL (ref 32.0–36.0)
MCV: 93.5 fL (ref 80.0–100.0)
MPV: 11.8 fL (ref 7.5–12.5)
Monocytes Relative: 7.2 %
Neutro Abs: 5370 cells/uL (ref 1500–7800)
Neutrophils Relative %: 66.3 %
Platelets: 258 10*3/uL (ref 140–400)
RBC: 4.3 10*6/uL (ref 3.80–5.10)
RDW: 12.6 % (ref 11.0–15.0)
Total Lymphocyte: 23.6 %
WBC: 8.1 10*3/uL (ref 3.8–10.8)

## 2021-09-27 LAB — COMPLETE METABOLIC PANEL WITH GFR
AG Ratio: 1.8 (calc) (ref 1.0–2.5)
ALT: 23 U/L (ref 6–29)
AST: 28 U/L (ref 10–35)
Albumin: 4.5 g/dL (ref 3.6–5.1)
Alkaline phosphatase (APISO): 69 U/L (ref 37–153)
BUN: 13 mg/dL (ref 7–25)
CO2: 30 mmol/L (ref 20–32)
Calcium: 9.8 mg/dL (ref 8.6–10.4)
Chloride: 101 mmol/L (ref 98–110)
Creat: 0.94 mg/dL (ref 0.50–1.05)
Globulin: 2.5 g/dL (calc) (ref 1.9–3.7)
Glucose, Bld: 107 mg/dL — ABNORMAL HIGH (ref 65–99)
Potassium: 3.4 mmol/L — ABNORMAL LOW (ref 3.5–5.3)
Sodium: 143 mmol/L (ref 135–146)
Total Bilirubin: 0.9 mg/dL (ref 0.2–1.2)
Total Protein: 7 g/dL (ref 6.1–8.1)
eGFR: 68 mL/min/{1.73_m2} (ref >=60)

## 2021-09-27 LAB — VITAMIN D 1,25 DIHYDROXY
Vitamin D 1, 25 (OH)2 Total: 46 pg/mL (ref 18–72)
Vitamin D2 1, 25 (OH)2: 46 pg/mL
Vitamin D3 1, 25 (OH)2: <8 pg/mL

## 2021-09-27 LAB — TSH: TSH: 3.52 mIU/L (ref 0.40–4.50)

## 2021-09-27 MED ORDER — ATORVASTATIN CALCIUM 10 MG PO TABS: 10.0000 mg | ORAL_TABLET | Freq: Every day | ORAL | 3 refills | Status: DC

## 2021-09-27 MED ORDER — POTASSIUM CHLORIDE CRYS ER 20 MEQ PO TBCR: 40.0000 meq | EXTENDED_RELEASE_TABLET | Freq: Every day | ORAL | 0 refills | Status: DC

## 2021-09-27 NOTE — Progress Notes (Signed)
?This service is provided via telemedicine ? ?No vital signs collected/recorded due to the encounter was a telemedicine visit.  ? ?Location of patient (ex: home, work):  Home ? ?Patient consents to a telephone visit:  Yes ? ?Location of the provider (ex: office, home):  [employer]. ? ?Name of any referring provider:  Jalon Squier, Nelda Bucks, NP  ? ?Names of all persons participating in the telemedicine service and their role in the encounter:  Patient, Heriberto Antigua, Jarratt, [place], [place], NP.   ? ?Time spent on call: 8 minutes spent on the phone with Medical Assistant.   ? ? ? ? ?Provider: Marlowe Sax FNP-C ? ?Jenavi Beedle, Nelda Bucks, NP ? ?Patient Care Team: ?Debborah Alonge, Nelda Bucks, NP as PCP - General (Family Medicine) ?Rourk, Cristopher Estimable, MD (Gastroenterology) ? ?Extended Emergency Contact Information ?Primary Emergency Contact: Dillard,Katrina ?Address: [address] ?         [place], [place] [address] [place] ?Home Phone: [phone] ?Relation: Sister ? ?Code Status:  Full Code  ?Goals of care: Advanced Directive information ?Advanced Directives 09/27/2021  ?Does Patient Have a Medical Advance Directive? No  ?Does patient want to make changes to medical advance directive? -  ?Would patient like information on creating a medical advance directive? No - Patient declined  ?Pre-existing out of facility DNR order (yellow form or pink MOST form) -  ? ? ? ?Chief Complaint  ?Patient presents with  ? Follow-up  ?  Discuss labs with PCP Marlowe Sax, NP.  ? ? ?HPI:  ?Pt is a 61 y.o. female seen today for an acute visit to discuss lab results.Labs reviewed and discussed with patient.Lab work is unremarkable except glucose level slightly high Glucose hemoglobin A1c cutting blood to rule out type 2 diabetes ?Level is slightly low seems to be chronic 3.4 stable from previous visit. Asymptomatic ?Total cholesterol 286 and LDL 195 and triglycerides are within normal range at 117. ?She denies any acute  issues. ? ? ?Past Medical History:  ?Diagnosis Date  ? Acute pancreatitis 04/10/2011  ? ?secondary to HCTZ.   ? Anxiety   ? per new patient form  ? Arthritis   ? Asthma   ? Complication of anesthesia   ? pt. states that with previous surgery, she was difficult to get to sleep  ? GERD (gastroesophageal reflux disease)   ? H/O hiatal hernia   ? Headache   ? history of migraines  ? High cholesterol   ? Per new patient form  ? History of depression 07/10/2006  ? History of pneumonia 07/11/1995  ? Hyperlipidemia   ? stopped statins due to muscle pain  ? Hypertension   ? Hypothyroidism   ? pt. denies  ? Noncompliance   ? Numbness and tingling in left arm   ? Obesity   ? Palpitations   ? PONV (postoperative nausea and vomiting)   ? ?Past Surgical History:  ?Procedure Laterality Date  ? ANTERIOR CERVICAL DECOMP/DISCECTOMY FUSION N/A 05/27/2015  ? Procedure: Anterior Cervical Diskectomy and Fusion-Cervical four-five, Cervical five-six ;  Surgeon: Eustace Moore, MD;  Location: [facility] NEURO ORS;  Service: Neurosurgery;  Laterality: N/A;  ? APPENDECTOMY  1979  ? CARDIAC CATHETERIZATION  2005  ? 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 COLONOSCOPY  2011  ? Dr. Carol Ada. Per pt, colon polyps and due in five years for f/u.  ? DG  BONE DENSITY ([place] HX)  2020  ? Per new patient form  ?  DIAGNOSTIC MAMMOGRAM  2021  ? Per new patient form  ? ESOPHAGOGASTRODUODENOSCOPY  1990s  ? duodenitis per patient  ? ESOPHAGOGASTRODUODENOSCOPY  05/24/2011  ? Procedure: ESOPHAGOGASTRODUODENOSCOPY (EGD);  Surgeon: Daneil Dolin, MD;  Location: [facility]O [facility];  Service: Endoscopy;  Laterality: N/A;  1:05  ? REPLACEMENT TOTAL KNEE Left 2021  ? Per new patient form  ? TONSILLECTOMY  1984  ? TOTAL ABDOMINAL HYSTERECTOMY  1991  ? TRIGGER FINGER RELEASE Right 2015  ? thumb  ? ? ?Allergies  ?Allergen Reactions  ? Percocet [Oxycodone-Acetaminophen] Itching  ?  Terrible itching  ? Codeine Itching  ?  Bad itching  ? ? ?Outpatient Encounter  Medications as of 09/27/2021  ?Medication Sig  ? albuterol (VENTOLIN HFA) 108 (90 Base) MCG/ACT inhaler Inhale 2 puffs into the lungs every 6 (six) hours as needed for wheezing or shortness of breath.  ? b complex vitamins capsule Take 1 capsule by mouth daily.  ? busPIRone (BUSPAR) 5 MG tablet Take 1 tablet by mouth 2 (two) times daily.  ? ergocalciferol (VITAMIN D2) 1.25 MG (50000 UT) capsule TAKE 1 CAPSULE BY MOUTH TWICE A WEEK.  ? hydrochlorothiazide (MICROZIDE) 12.5 MG capsule Take 1 capsule (12.5 mg total) by mouth daily.  ? Ipratropium-Albuterol (DUONEB IN) Inhale 1 Inhaler into the lungs.  ? losartan (COZAAR) 100 MG tablet Take 1 tablet (100 mg total) by mouth daily.  ? metoprolol succinate (TOPROL-XL) 100 MG 24 hr tablet Take 1 tablet (100 mg total) by mouth daily.  ? Omega-3 Fatty Acids (FISH OIL PO) Take by mouth.  ? pantoprazole (PROTONIX) 40 MG tablet Take 1 tablet (40 mg total) by mouth daily.  ? sertraline (ZOLOFT) 50 MG tablet Take 1 tablet (50 mg total) by mouth daily.  ? ?No facility-administered encounter medications on file as of 09/27/2021.  ? ? ?Review of Systems  ?Constitutional:  Negative for appetite change, chills, fatigue, fever and unexpected weight change.  ?Respiratory:  Negative for cough, chest tightness, shortness of breath and wheezing.   ?Cardiovascular:  Negative for chest pain, palpitations and leg swelling.  ?Gastrointestinal:  Negative for abdominal distention, abdominal pain, constipation, nausea and vomiting.  ?Endocrine: Negative for polydipsia, polyphagia and polyuria.  ?Neurological:  Negative for dizziness, speech difficulty, weakness, light-headedness, numbness and headaches.  ? ?Immunization History  ?Administered Date(s) Administered  ? Influenza Split 04/10/2016, 04/18/2018  ? Influenza-Unspecified 04/24/2017, 04/15/2019, 04/29/2020, 04/28/2021  ? Moderna Sars-Covid-2 Vaccination 09/25/2019, 10/23/2019  ? Pension scheme manager 66yr & up 11/30/2020  ?  Tdap 07/11/2011  ? ?Pertinent  Health Maintenance Due  ?Topic Date Due  ? PAP SMEAR-Modifier  Never done  ? COLONOSCOPY (Pts 45-498yrInsurance coverage will need to be confirmed)  Never done  ? MAMMOGRAM  11/18/2018  ? INFLUENZA VACCINE  Completed  ? ?Fall Risk 05/26/2021 09/06/2021 09/21/2021 09/27/2021  ?Falls in the past year? - 0 0 0  ?Was there an injury with Fall? - 0 0 0  ?Fall Risk Category Calculator - 0 0 0  ?Fall Risk Category - Low Low Low  ?Patient Fall Risk Level Low fall risk Low fall risk Low fall risk Low fall risk  ?Patient at Risk for Falls Due to - No Fall Risks No Fall Risks No Fall Risks  ?Fall risk Follow up - Falls evaluation completed Falls evaluation completed Falls evaluation completed  ? ?Functional Status Survey: ?  ? ?There were no vitals filed for this visit. ?There is no height or weight on file  to calculate BMI. ?Physical Exam ?Constitutional:   ?   General: She is not in acute distress. ?   Appearance: She is not ill-appearing.  ?Pulmonary:  ?   Effort: Pulmonary effort is normal. No respiratory distress.  ?Neurological:  ?   Mental Status: She is alert and oriented to person, place, and time.  ?Psychiatric:     ?   Mood and Affect: Mood normal.     ?   Behavior: Behavior normal.  ? ? ?Labs reviewed: ?Recent Labs  ?  09/21/21 ?0908  ?NA 143  ?K 3.4*  ?CL 101  ?CO2 30  ?GLUCOSE 107*  ?BUN 13  ?CREATININE 0.94  ?CALCIUM 9.8  ? ?Recent Labs  ?  09/21/21 ?0908  ?AST 28  ?ALT 23  ?BILITOT 0.9  ?PROT 7.0  ? ?Recent Labs  ?  09/21/21 ?0908  ?WBC 8.1  ?NEUTROABS 5,370  ?HGB 13.5  ?HCT 40.2  ?MCV 93.5  ?PLT 258  ? ?Lab Results  ?Component Value Date  ? TSH 3.52 09/21/2021  ? ?No results found for: HGBA1C ?Lab Results  ?Component Value Date  ? CHOL 286 (H) 09/21/2021  ? HDL 67 09/21/2021  ? LDLCALC 195 (H) 09/21/2021  ? TRIG 117 09/21/2021  ? CHOLHDL 4.3 09/21/2021  ? ? ?Significant Diagnostic Results in last 30 days:  ?No results found. ? ?Assessment/Plan ?1. Essential hypertension ?Continue  on statin and hydrochlorothiazide ?Monitor potassium ?- CBC with Differential/Platelet; Future ?- BMP with eGFR([facility]); Future ? ?2. Mixed hyperlipidemia ?total cholesterol was 286 and LDL was 195 triglycerides w

## 2021-10-05 ENCOUNTER — Other Ambulatory Visit: Payer: Self-pay | Admitting: Family

## 2021-10-05 DIAGNOSIS — Z1231 Encounter for screening mammogram for malignant neoplasm of breast: Secondary | ICD-10-CM

## 2021-10-10 ENCOUNTER — Other Ambulatory Visit: Payer: Self-pay

## 2021-10-10 MED ORDER — BUSPIRONE HCL 5 MG PO TABS
5.0000 mg | ORAL_TABLET | Freq: Two times a day (BID) | ORAL | 0 refills | Status: DC
  Filled 2021-10-10: qty 180, 90d supply, fill #0

## 2021-10-10 MED ORDER — BUSPIRONE HCL 5 MG PO TABS: 5.0000 mg | ORAL_TABLET | Freq: Two times a day (BID) | ORAL | 1 refills | Status: DC

## 2021-10-10 MED ORDER — BUSPIRONE HCL 5 MG PO TABS: 5.0000 mg | ORAL_TABLET | Freq: Two times a day (BID) | ORAL | 0 refills | Status: DC

## 2021-10-26 ENCOUNTER — Ambulatory Visit (INDEPENDENT_AMBULATORY_CARE_PROVIDER_SITE_OTHER): Payer: 59 | Admitting: Family

## 2021-10-26 ENCOUNTER — Encounter: Payer: Self-pay | Admitting: Family

## 2021-10-26 VITALS — BP 160/110 | HR 55 | Temp 97.3°F | Resp 20 | Ht 63.0 in | Wt 226.0 lb

## 2021-10-26 DIAGNOSIS — I10 Essential (primary) hypertension: Secondary | ICD-10-CM

## 2021-10-26 MED ORDER — HYDRALAZINE HCL 10 MG PO TABS: 10.0000 mg | ORAL_TABLET | Freq: Three times a day (TID) | ORAL | 0 refills | Status: DC

## 2021-10-26 NOTE — Patient Instructions (Signed)
-   check Blood pressure at home and record on log provided and notify provider if B/p > 140/90  ? ?

## 2021-10-26 NOTE — Progress Notes (Signed)
? ?Provider: Richarda Blade FNP-C ? ?Gwynn Chalker, Donalee Citrin, NP ? ?Patient Care Team: ?Zlatan Hornback, Donalee Citrin, NP as PCP - General (Family Medicine) ?Rourk, Gerrit Friends, MD (Gastroenterology) ? ?Extended Emergency Contact Information ?Primary Emergency Contact: Dillard,Katrina ?Address: [address] ?         [place], [place] [address] [place] ?Home Phone: [phone] ?Relation: Sister ? ?Code Status: Full code ?Goals of care: Advanced Directive information ? ?  10/26/2021  ?  2:21 PM  ?Advanced Directives  ?Does Patient Have a Medical Advance Directive? No  ?Would patient like information on creating a medical advance directive? No - Patient declined  ? ? ? ?Chief Complaint  ?Patient presents with  ? Medical Management of Chronic Issues  ?  1 month follow up for elevated BP  ? ? ?HPI:  ?Pt is a 61 y.o. female seen today for an acute visit for follow-up for high blood pressure.  She brought in her blood pressure log readings ranging in the 120s over 60s to 140s over 70s with occasional readings running in the 150s over 80s to 170s over 90s.  Heart rate in the 60s to 80's.denies any headache,dizziness,vision changes,fatigue,chest tightness,palpitation,chest pain or shortness of breath.    ?Blood pressure today is elevated 160/110.  Denies any symptoms. ? ? ?Past Medical History:  ?Diagnosis Date  ? Acute pancreatitis 04/10/2011  ? ?secondary to HCTZ.   ? Anxiety   ? per new patient form  ? Arthritis   ? Asthma   ? Complication of anesthesia   ? pt. states that with previous surgery, she was difficult to get to sleep  ? GERD (gastroesophageal reflux disease)   ? H/O hiatal hernia   ? Headache   ? history of migraines  ? High cholesterol   ? Per new patient form  ? History of depression 07/10/2006  ? History of pneumonia 07/11/1995  ? Hyperlipidemia   ? stopped statins due to muscle pain  ? Hypertension   ? Hypothyroidism   ? pt. denies  ? Noncompliance   ? Numbness and tingling in left arm   ? Obesity   ?  Palpitations   ? PONV (postoperative nausea and vomiting)   ? ?Past Surgical History:  ?Procedure Laterality Date  ? ANTERIOR CERVICAL DECOMP/DISCECTOMY FUSION N/A 05/27/2015  ? Procedure: Anterior Cervical Diskectomy and Fusion-Cervical four-five, Cervical five-six ;  Surgeon: Tia Alert, MD;  Location: [facility] NEURO ORS;  Service: Neurosurgery;  Laterality: N/A;  ? APPENDECTOMY  1979  ? CARDIAC CATHETERIZATION  2005  ? 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 COLONOSCOPY  2011  ? Dr. Jeani Hawking. Per pt, colon polyps and due in five years for f/u.  ? DG  BONE DENSITY ([facility] HX)  2020  ? Per new patient form  ? DIAGNOSTIC MAMMOGRAM  2021  ? Per new patient form  ? ESOPHAGOGASTRODUODENOSCOPY  1990s  ? duodenitis per patient  ? ESOPHAGOGASTRODUODENOSCOPY  05/24/2011  ? Procedure: ESOPHAGOGASTRODUODENOSCOPY (EGD);  Surgeon: Corbin Ade, MD;  Location: [facility];  Service: Endoscopy;  Laterality: N/A;  1:05  ? REPLACEMENT TOTAL KNEE Left 2021  ? Per new patient form  ? TONSILLECTOMY  1984  ? TOTAL ABDOMINAL HYSTERECTOMY  1991  ? TRIGGER FINGER RELEASE Right 2015  ? thumb  ? ? ?Allergies  ?Allergen Reactions  ? Percocet [Oxycodone-Acetaminophen] Itching  ?  Terrible itching  ? Codeine Itching  ?  Bad itching  ? ? ?Outpatient  Encounter Medications as of 10/26/2021  ?Medication Sig  ? albuterol (VENTOLIN HFA) 108 (90 Base) MCG/ACT inhaler Inhale 2 puffs into the lungs every 6 (six) hours as needed for wheezing or shortness of breath.  ? atorvastatin (LIPITOR) 10 MG tablet Take 1 tablet (10 mg total) by mouth daily.  ? b complex vitamins capsule Take 1 capsule by mouth daily.  ? busPIRone (BUSPAR) 5 MG tablet Take 1 tablet (5 mg total) by mouth 2 (two) times daily.  ? ergocalciferol (VITAMIN D2) 1.25 MG (50000 UT) capsule TAKE 1 CAPSULE BY MOUTH TWICE A WEEK.  ? hydrALAZINE (APRESOLINE) 10 MG tablet Take 1 tablet (10 mg total) by mouth 3 (three) times daily.  ? hydrochlorothiazide (MICROZIDE) 12.5 MG capsule  Take 1 capsule (12.5 mg total) by mouth daily.  ? Ipratropium-Albuterol (DUONEB IN) Inhale 1 Inhaler into the lungs.  ? losartan (COZAAR) 100 MG tablet Take 1 tablet (100 mg total) by mouth daily.  ? metoprolol succinate (TOPROL-XL) 100 MG 24 hr tablet Take 1 tablet (100 mg total) by mouth daily.  ? Omega-3 Fatty Acids (FISH OIL PO) Take by mouth.  ? pantoprazole (PROTONIX) 40 MG tablet Take 1 tablet (40 mg total) by mouth daily.  ? sertraline (ZOLOFT) 50 MG tablet Take 1 tablet (50 mg total) by mouth daily.  ? potassium chloride SA (KLOR-CON M) 20 MEQ tablet Take 2 tablets (40 mEq total) by mouth daily for 3 days.  ? ?No facility-administered encounter medications on file as of 10/26/2021.  ? ? ?Review of Systems  ?Constitutional:  Negative for appetite change, chills, fatigue, fever and unexpected weight change.  ?HENT:  Negative for congestion, ear discharge, ear pain, hearing loss, nosebleeds, postnasal drip, rhinorrhea, sinus pressure, sinus pain, sneezing, sore throat, tinnitus and trouble swallowing.   ?Eyes:  Negative for pain, discharge, redness, itching and visual disturbance.  ?Respiratory:  Negative for cough, chest tightness, shortness of breath and wheezing.   ?Cardiovascular:  Negative for chest pain, palpitations and leg swelling.  ?Gastrointestinal:  Negative for abdominal distention, abdominal pain, diarrhea, nausea and vomiting.  ?Genitourinary:  Negative for difficulty urinating, dysuria, flank pain, frequency and urgency.  ?Musculoskeletal:  Negative for arthralgias, back pain, gait problem, joint swelling, myalgias, neck pain and neck stiffness.  ?Skin:  Negative for color change, pallor and rash.  ?Neurological:  Negative for dizziness, weakness, light-headedness, numbness and headaches.  ? ?Immunization History  ?Administered Date(s) Administered  ? Influenza Split 04/10/2016, 04/18/2018  ? Influenza-Unspecified 04/24/2017, 04/15/2019, 04/29/2020, 04/28/2021  ? Moderna Sars-Covid-2 Vaccination  09/25/2019, 10/23/2019  ? [employer] Covid-19 Vaccine Bivalent Booster 8757yrs & up 11/30/2020  ? Tdap 07/11/2011  ? ?Pertinent  Health Maintenance Due  ?Topic Date Due  ? PAP SMEAR-Modifier  Never done  ? COLONOSCOPY (Pts 45-884yrs Insurance coverage will need to be confirmed)  Never done  ? MAMMOGRAM  11/18/2018  ? INFLUENZA VACCINE  02/07/2022  ? ? ?  05/26/2021  ?  6:47 PM 09/06/2021  ?  1:23 PM 09/21/2021  ?  8:20 AM 09/27/2021  ?  3:48 PM 10/26/2021  ?  2:21 PM  ?Fall Risk  ?Falls in the past year?  0 0 0 0  ?Was there an injury with Fall?  0 0 0 0  ?Fall Risk Category Calculator  0 0 0 0  ?Fall Risk Category  Low Low Low Low  ?Patient Fall Risk Level Low fall risk Low fall risk Low fall risk Low fall risk Low fall risk  ?Patient at  Risk for Falls Due to  No Fall Risks No Fall Risks No Fall Risks No Fall Risks  ?Fall risk Follow up  Falls evaluation completed Falls evaluation completed Falls evaluation completed Falls evaluation completed  ? ?Functional Status Survey: ?  ? ?Vitals:  ? 10/26/21 1419  ?BP: (!) 160/110  ?Pulse: (!) 55  ?Resp: 20  ?Temp: (!) 97.3 ?F (36.3 ?C)  ?SpO2: 96%  ?Weight: 226 lb (102.5 kg)  ?Height: 5\' 3"  (1.6 m)  ? ?Body mass index is 40.03 kg/m? ?Physical Exam ?Vitals reviewed.  ?Constitutional:   ?   General: She is not in acute distress. ?   Appearance: Normal appearance. She is obese. She is not ill-appearing or diaphoretic.  ?HENT:  ?   Head: Normocephalic.  ?   Mouth/Throat:  ?   Mouth: Mucous membranes are moist.  ?   Pharynx: Oropharynx is clear. No oropharyngeal exudate or posterior oropharyngeal erythema.  ?Eyes:  ?   General: No scleral icterus.    ?   Right eye: No discharge.     ?   Left eye: No discharge.  ?   Extraocular Movements: Extraocular movements intact.  ?   Conjunctiva/sclera: Conjunctivae normal.  ?   Pupils: Pupils are equal, round, and reactive to light.  ?Neck:  ?   Vascular: No carotid bruit.  ?Cardiovascular:  ?   Rate and Rhythm: Normal rate and regular rhythm.  ?    Pulses: Normal pulses.  ?   Heart sounds: Normal heart sounds. No murmur heard. ?  No friction rub. No gallop.  ?Pulmonary:  ?   Effort: Pulmonary effort is normal. No respiratory distress.  ?   Breath s

## 2021-11-10 ENCOUNTER — Ambulatory Visit (INDEPENDENT_AMBULATORY_CARE_PROVIDER_SITE_OTHER): Payer: 59

## 2021-11-10 DIAGNOSIS — Z1231 Encounter for screening mammogram for malignant neoplasm of breast: Secondary | ICD-10-CM | POA: Diagnosis not present

## 2021-11-28 ENCOUNTER — Ambulatory Visit (INDEPENDENT_AMBULATORY_CARE_PROVIDER_SITE_OTHER): Payer: 59 | Admitting: Family

## 2021-11-28 ENCOUNTER — Encounter: Payer: Self-pay | Admitting: Family

## 2021-11-28 VITALS — BP 148/100 | HR 60 | Temp 97.1°F | Resp 20 | Ht 63.0 in | Wt 235.1 lb

## 2021-11-28 DIAGNOSIS — K582 Mixed irritable bowel syndrome: Secondary | ICD-10-CM | POA: Diagnosis not present

## 2021-11-28 DIAGNOSIS — I10 Essential (primary) hypertension: Secondary | ICD-10-CM | POA: Diagnosis not present

## 2021-11-28 MED ORDER — HYDRALAZINE HCL 25 MG PO TABS: 25.0000 mg | ORAL_TABLET | Freq: Three times a day (TID) | ORAL | 0 refills | Status: DC

## 2021-11-28 MED ORDER — DICYCLOMINE HCL 10 MG PO CAPS: 10.0000 mg | ORAL_CAPSULE | Freq: Three times a day (TID) | ORAL | 0 refills | Status: DC

## 2021-11-28 MED ORDER — ERGOCALCIFEROL 1.25 MG (50000 UT) PO CAPS: ORAL_CAPSULE | ORAL | 3 refills | Status: DC

## 2021-11-28 MED ORDER — LOSARTAN POTASSIUM 100 MG PO TABS: 100.0000 mg | ORAL_TABLET | Freq: Every day | ORAL | 0 refills | Status: DC

## 2021-11-28 NOTE — Progress Notes (Signed)
Provider: Richarda Blade FNP-C  Dionne Rossa, Donalee Citrin, NP  Patient Care Team: Raine Elsass, Donalee Citrin, NP as PCP - General (Family Medicine) Corbin Ade, MD (Gastroenterology)  Extended Emergency Contact Information Primary Emergency Contact: Dillard,Katrina Address: [address]          [place], [place] [address] [place] Home Phone: [phone] Relation: Sister  Code Status:  Full Code  Goals of care: Advanced Directive information    10/26/2021    2:21 PM  Advanced Directives  Does Patient Have a Medical Advance Directive? No  Would patient like information on creating a medical advance directive? No - Patient declined     Chief Complaint  Patient presents with   Medical Management of Chronic Issues    Patient presents today for a 1 month hypertension follow-up. She reports BP is around 110's-180's/70'-80's    HPI:  Pt is a 61 y.o. female seen today for an acute visit for 1 month follow-up for high blood pressure.  States systolic blood pressure in the mornings ranges 1 66-180s and afternoon blood pressure ranges in the 110s to 150s with few readings in the 160s.  Heart rate ranges in the upper 50s to 60s.  Currently on hydralazine 10 mg 3 times daily, hydrochlorothiazide 12.5 mg daily losartan 100 mg daily and Metroprolol succinate 100 mg daily.denies any headache,dizziness,vision changes,fatigue,chest tightness,palpitation,chest pain or shortness of breath.      Past Medical History:  Diagnosis Date   Acute pancreatitis 04/10/2011   ?secondary to HCTZ.    Anxiety    per new patient form   Arthritis    Asthma    Complication of anesthesia    pt. states that with previous surgery, she was difficult to get to sleep   GERD (gastroesophageal reflux disease)    H/O hiatal hernia    Headache    history of migraines   High cholesterol    Per new patient form   History of depression 07/10/2006   History of pneumonia 07/11/1995   Hyperlipidemia     stopped statins due to muscle pain   Hypertension    Hypothyroidism    pt. 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 Jeani Hawking. Per pt, colon polyps and due in five years for f/u.   DG  BONE DENSITY ([facility] HX)  2020   Per new patient form   DIAGNOSTIC MAMMOGRAM  2021   Per new patient form   ESOPHAGOGASTRODUODENOSCOPY  1990s   duodenitis per patient   ESOPHAGOGASTRODUODENOSCOPY  05/24/2011   Procedure: ESOPHAGOGASTRODUODENOSCOPY (EGD);  Surgeon: Corbin Ade, MD;  Location: [facility] ENDO [facility];  Service: Endoscopy;  Laterality: N/A;  1:05   REPLACEMENT TOTAL KNEE Left 2021   Per new patient form   TONSILLECTOMY  1984   TOTAL ABDOMINAL HYSTERECTOMY  1991   TRIGGER FINGER RELEASE Right 2015   thumb    Allergies  Allergen Reactions  Percocet [Oxycodone-Acetaminophen] Itching    Terrible itching   Codeine Itching    Bad itching    Outpatient Encounter Medications as of 11/28/2021  Medication Sig   albuterol (VENTOLIN HFA) 108 (90 Base) MCG/ACT inhaler Inhale 2 puffs into the lungs every 6 (six) hours as needed for wheezing or shortness of breath.   atorvastatin (LIPITOR) 10 MG tablet Take 1 tablet (10 mg total) by mouth daily.   b complex vitamins capsule Take 1 capsule by mouth daily.   busPIRone (BUSPAR) 5 MG tablet Take 1 tablet (5 mg total) by mouth 2 (two) times daily.   dicyclomine (BENTYL) 10 MG capsule Take 1 capsule (10 mg total) by mouth 3 (three) times  daily before meals.   hydrochlorothiazide (MICROZIDE) 12.5 MG capsule Take 1 capsule (12.5 mg total) by mouth daily.   Ipratropium-Albuterol (DUONEB IN) Inhale 1 Inhaler into the lungs.   metoprolol succinate (TOPROL-XL) 100 MG 24 hr tablet Take 1 tablet (100 mg total) by mouth daily.   Omega-3 Fatty Acids (FISH OIL PO) Take by mouth.   pantoprazole (PROTONIX) 40 MG tablet Take 1 tablet (40 mg total) by mouth daily.   sertraline (ZOLOFT) 50 MG tablet Take 1 tablet (50 mg total) by mouth daily.   [DISCONTINUED] ergocalciferol (VITAMIN D2) 1.25 MG (50000 UT) capsule TAKE 1 CAPSULE BY MOUTH TWICE A WEEK.   [DISCONTINUED] hydrALAZINE (APRESOLINE) 10 MG tablet Take 1 tablet (10 mg total) by mouth 3 (three) times daily.   [DISCONTINUED] losartan (COZAAR) 100 MG tablet Take 1 tablet (100 mg total) by mouth daily.   ergocalciferol (VITAMIN D2) 1.25 MG (50000 UT) capsule TAKE 1 CAPSULE BY MOUTH TWICE A WEEK.   hydrALAZINE (APRESOLINE) 25 MG tablet Take 1 tablet (25 mg total) by mouth 3 (three) times daily.   losartan (COZAAR) 100 MG tablet Take 1 tablet (100 mg total) by mouth at bedtime.   potassium chloride SA (KLOR-CON M) 20 MEQ tablet Take 2 tablets (40 mEq total) by mouth daily for 3 days.   No facility-administered encounter medications on file as of 11/28/2021.    Review of Systems  Constitutional:  Negative for appetite change, chills, fatigue and fever.  Eyes:  Negative for pain, discharge, redness and visual disturbance.  Respiratory:  Negative for cough, chest tightness, shortness of breath and wheezing.   Cardiovascular:  Negative for chest pain, palpitations and leg swelling.  Gastrointestinal:  Negative for abdominal distention, abdominal pain, nausea and vomiting.  Skin:  Negative for color change, pallor and rash.  Neurological:  Negative for dizziness, speech difficulty, weakness, light-headedness, numbness and headaches.   Immunization History  Administered Date(s) Administered    Influenza Split 04/10/2016, 04/18/2018   Influenza-Unspecified 04/24/2017, 04/15/2019, 04/29/2020, 04/28/2021   Moderna Sars-Covid-2 Vaccination 09/25/2019, 10/23/2019   Pfizer Covid-19 Vaccine Bivalent Booster 73yrs & up 11/30/2020   Tdap 07/11/2011   Pertinent  Health Maintenance Due  Topic Date Due   PAP SMEAR-Modifier  Never done   COLONOSCOPY (Pts 45-24yrs Insurance coverage will need to be confirmed)  Never done   INFLUENZA VACCINE  02/07/2022   MAMMOGRAM  11/11/2023      05/26/2021    6:47 PM 09/06/2021    1:23 PM 09/21/2021    8:20 AM 09/27/2021    3:48 PM 10/26/2021    2:21 PM  Fall Risk  Falls in the past year?  0 0 0 0  Was there an injury with Fall?  0 0 0 0  Fall Risk Category Calculator  0 0 0 0  Fall Risk Category  Low Low Low Low  Patient Fall Risk Level Low fall risk Low fall risk Low fall risk Low fall risk Low fall risk  Patient at Risk for Falls Due to  No Fall Risks No Fall Risks No Fall Risks No Fall Risks  Fall risk Follow up  Falls evaluation completed Falls evaluation completed Falls evaluation completed Falls evaluation completed   Functional Status Survey:    Vitals:   11/28/21 1352  BP: (!) 148/100  Pulse: 60  Resp: 20  Temp: (!) 97.1 F (36.2 C)  SpO2: 98%  Weight: 235 lb 1.6 oz (106.6 kg)  Height: 5\' 3"  (1.6 m)   Body mass index is 41.65 kg/m. Physical Exam Vitals reviewed.  Constitutional:      General: She is not in acute distress.    Appearance: Normal appearance. She is obese. She is not ill-appearing or diaphoretic.  HENT:     Left Ear: There is no impacted cerumen.     Mouth/Throat:     Mouth: Mucous membranes are moist.     Pharynx: Oropharynx is clear. No oropharyngeal exudate or posterior oropharyngeal erythema.  Eyes:     General: No scleral icterus.       Right eye: No discharge.        Left eye: No discharge.     Extraocular Movements: Extraocular movements intact.     Conjunctiva/sclera: Conjunctivae normal.      Pupils: Pupils are equal, round, and reactive to light.  Cardiovascular:     Rate and Rhythm: Normal rate and regular rhythm.     Pulses: Normal pulses.     Heart sounds: Normal heart sounds. No murmur heard.   No friction rub. No gallop.  Pulmonary:     Effort: Pulmonary effort is normal. No respiratory distress.     Breath sounds: Normal breath sounds. No wheezing, rhonchi or rales.  Chest:     Chest wall: No tenderness.  Abdominal:     General: Bowel sounds are normal. There is no distension.     Palpations: Abdomen is soft. There is no mass.     Tenderness: There is no abdominal tenderness. There is no right CVA tenderness, left CVA tenderness, guarding or rebound.  Musculoskeletal:        General: No swelling or tenderness. Normal range of motion.     Cervical back: Normal range of motion. No rigidity or tenderness.     Right lower leg: No edema.     Left lower leg: No edema.  Lymphadenopathy:     Cervical: No cervical adenopathy.  Skin:    General: Skin is warm and dry.     Coloration: Skin is not pale.     Findings: No erythema or rash.  Neurological:     Mental Status: She is alert and oriented to person, place, and time.     Sensory: No sensory deficit.     Motor: No weakness.     Gait: Gait normal.  Psychiatric:        Speech: Speech normal.    Labs reviewed: Recent Labs    09/21/21 0908  NA 143  K 3.4*  CL 101  CO2 30  GLUCOSE 107*  BUN 13  CREATININE 0.94  CALCIUM 9.8   Recent Labs    09/21/21 0908  AST 28  ALT 23  BILITOT 0.9  PROT 7.0   Recent Labs    09/21/21 0908  WBC 8.1  NEUTROABS 5,370  HGB 13.5  HCT 40.2  MCV 93.5  PLT 258   Lab Results  Component Value Date   TSH 3.52 09/21/2021   No results found for: HGBA1C Lab Results  Component Value Date   CHOL 286 (H) 09/21/2021   HDL 67 09/21/2021   LDLCALC 195 (H) 09/21/2021   TRIG 117 09/21/2021   CHOLHDL 4.3 09/21/2021    Significant Diagnostic Results in last 30 days:  MM  3D SCREEN BREAST BILATERAL  Result Date: 11/14/2021 CLINICAL DATA:  Screening. EXAM: DIGITAL SCREENING BILATERAL MAMMOGRAM WITH TOMOSYNTHESIS AND CAD TECHNIQUE: Bilateral screening digital craniocaudal and mediolateral oblique mammograms were obtained. Bilateral screening digital breast tomosynthesis was performed. The images were evaluated with computer-aided detection. COMPARISON:  Previous exam(s). ACR Breast Density Category b: There are scattered areas of fibroglandular density. FINDINGS: There are no findings suspicious for malignancy. IMPRESSION: No mammographic evidence of malignancy. A result letter of this screening mammogram will be mailed directly to the patient. RECOMMENDATION: Screening mammogram in one year. (Code:SM-B-01Y) BI-RADS CATEGORY  1: Negative. Electronically Signed   By: Elberta Fortis M.D.   On: 11/14/2021 14:49    Assessment/Plan 1. Essential hypertension Blood pressure still elevated Recommended to take losartan at bedtime Will increase hydralazine from 10 mg 3 times daily to 25 mg 3 times daily. -- Advised to check Blood pressure at home and record on log provided and notify provider if B/p > 140/90  - losartan (COZAAR) 100 MG tablet; Take 1 tablet (100 mg total) by mouth at bedtime.  Dispense: 90 tablet; Refill: 0 - hydrALAZINE (APRESOLINE) 25 MG tablet; Take 1 tablet (25 mg total) by mouth 3 (three) times daily.  Dispense: 270 tablet; Refill: 0 -Follow-up in 2 weeks for evaluation of blood pressure if still high will refer to hypertensive clinic for further management.  2. Irritable bowel syndrome with both constipation and diarrhea Reports symptoms have been worse recently we will diet like to restart Bentyl which was effective in the past -States colonoscopy has been scheduled for several months away unable to wait. - dicyclomine (BENTYL) 10 MG capsule; Take 1 capsule (10 mg total) by mouth 3 (three) times daily before meals.  Dispense: 270 capsule; Refill:  0  Family/ staff Communication: Reviewed plan of care with patient verbalized understanding  Labs/tests ordered: None   Next Appointment: 2 weeks for follow-up high blood pressure  Olney Monier C Wreatha Sturgeon, NP

## 2021-11-28 NOTE — Patient Instructions (Addendum)
- take Losartan 100 mg tablet one by mouth daily at bedtime   - Increase Hydralazine from 10 mg tablet to 25 mg tablet one by mouth three times daily  DASH Eating Plan DASH stands for Dietary Approaches to Stop Hypertension. The DASH eating plan is a healthy eating plan that has been shown to: Reduce high blood pressure (hypertension). Reduce your risk for type 2 diabetes, heart disease, and stroke. Help with weight loss. What are tips for following this plan? Reading food labels Check food labels for the amount of salt (sodium) per serving. Choose foods with less than 5 percent of the Daily Value of sodium. Generally, foods with less than 300 milligrams (mg) of sodium per serving fit into this eating plan. To find whole grains, look for the word "whole" as the first word in the ingredient list. Shopping Buy products labeled as "low-sodium" or "no salt added." Buy fresh foods. Avoid canned foods and pre-made or frozen meals. Cooking Avoid adding salt when cooking. Use salt-free seasonings or herbs instead of table salt or sea salt. Check with your health care provider or pharmacist before using salt substitutes. Do not fry foods. Cook foods using healthy methods such as baking, boiling, grilling, roasting, and broiling instead. Cook with heart-healthy oils, such as olive, canola, avocado, soybean, or sunflower oil. Meal planning  Eat a balanced diet that includes: 4 or more servings of fruits and 4 or more servings of vegetables each day. Try to fill one-half of your plate with fruits and vegetables. 6-8 servings of whole grains each day. Less than 6 oz (170 g) of lean meat, poultry, or fish each day. A 3-oz (85-g) serving of meat is about the same size as a deck of cards. One egg equals 1 oz (28 g). 2-3 servings of low-fat dairy each day. One serving is 1 cup (237 mL). 1 serving of nuts, seeds, or beans 5 times each week. 2-3 servings of heart-healthy fats. Healthy fats called omega-3  fatty acids are found in foods such as walnuts, flaxseeds, fortified milks, and eggs. These fats are also found in cold-water fish, such as sardines, salmon, and mackerel. Limit how much you eat of: Canned or prepackaged foods. Food that is high in trans fat, such as some fried foods. Food that is high in saturated fat, such as fatty meat. Desserts and other sweets, sugary drinks, and other foods with added sugar. Full-fat dairy products. Do not salt foods before eating. Do not eat more than 4 egg yolks a week. Try to eat at least 2 vegetarian meals a week. Eat more home-cooked food and less restaurant, buffet, and fast food. Lifestyle When eating at a restaurant, ask that your food be prepared with less salt or no salt, if possible. If you drink alcohol: Limit how much you use to: 0-1 drink a day for women who are not pregnant. 0-2 drinks a day for men. Be aware of how much alcohol is in your drink. In the [place] one drink equals one 12 oz bottle of beer (355 mL), one 5 oz glass of wine (148 mL), or one 1 oz glass of hard liquor (44 mL). General information Avoid eating more than 2,300 mg of salt a day. If you have hypertension, you may need to reduce your sodium intake to 1,500 mg a day. Work with your health care provider to maintain a healthy body weight or to lose weight. Ask what an ideal weight is for you. Get at least 30 minutes  of exercise that causes your heart to beat faster (aerobic exercise) most days of the week. Activities may include walking, swimming, or biking. Work with your health care provider or dietitian to adjust your eating plan to your individual calorie needs. What foods should I eat? Fruits All fresh, dried, or frozen fruit. Canned fruit in natural juice (without added sugar). Vegetables Fresh or frozen vegetables (raw, steamed, roasted, or grilled). Low-sodium or reduced-sodium tomato and vegetable juice. Low-sodium or reduced-sodium tomato sauce and tomato  paste. Low-sodium or reduced-sodium canned vegetables. Grains Whole-grain or whole-wheat bread. Whole-grain or whole-wheat pasta. Brown rice. Orpah Cobb. Bulgur. Whole-grain and low-sodium cereals. Pita bread. Low-fat, low-sodium crackers. Whole-wheat flour tortillas. Meats and other proteins Skinless chicken or [place]. Ground chicken or [place]. Pork with fat trimmed off. Fish and seafood. Egg whites. Dried beans, peas, or lentils. Unsalted nuts, nut butters, and seeds. Unsalted canned beans. Lean cuts of beef with fat trimmed off. Low-sodium, lean precooked or cured meat, such as sausages or meat loaves. Dairy Low-fat (1%) or fat-free (skim) milk. Reduced-fat, low-fat, or fat-free cheeses. Nonfat, low-sodium ricotta or cottage cheese. Low-fat or nonfat yogurt. Low-fat, low-sodium cheese. Fats and oils Soft margarine without trans fats. Vegetable oil. Reduced-fat, low-fat, or light mayonnaise and salad dressings (reduced-sodium). Canola, safflower, olive, avocado, soybean, and sunflower oils. Avocado. Seasonings and condiments Herbs. Spices. Seasoning mixes without salt. Other foods Unsalted popcorn and pretzels. Fat-free sweets. The items listed above may not be a complete list of foods and beverages you can eat. Contact a dietitian for more information. What foods should I avoid? Fruits Canned fruit in a light or heavy syrup. Fried fruit. Fruit in cream or butter sauce. Vegetables Creamed or fried vegetables. Vegetables in a cheese sauce. Regular canned vegetables (not low-sodium or reduced-sodium). Regular canned tomato sauce and paste (not low-sodium or reduced-sodium). Regular tomato and vegetable juice (not low-sodium or reduced-sodium). Rosita Fire. Olives. Grains Baked goods made with fat, such as croissants, muffins, or some breads. Dry pasta or rice meal packs. Meats and other proteins Fatty cuts of meat. Ribs. Fried meat. Tomasa Blase. Bologna, salami, and other precooked or cured meats,  such as sausages or meat loaves. Fat from the back of a pig (fatback). Bratwurst. Salted nuts and seeds. Canned beans with added salt. Canned or smoked fish. Whole eggs or egg yolks. Chicken or [place] with skin. Dairy Whole or 2% milk, cream, and half-and-half. Whole or full-fat cream cheese. Whole-fat or sweetened yogurt. Full-fat cheese. Nondairy creamers. Whipped toppings. Processed cheese and cheese spreads. Fats and oils Butter. Stick margarine. Lard. Shortening. Ghee. Bacon fat. Tropical oils, such as coconut, palm kernel, or palm oil. Seasonings and condiments Onion salt, garlic salt, seasoned salt, table salt, and sea salt. Worcestershire sauce. Tartar sauce. Barbecue sauce. Teriyaki sauce. Soy sauce, including reduced-sodium. Steak sauce. Canned and packaged gravies. Fish sauce. Oyster sauce. Cocktail sauce. Store-bought horseradish. Ketchup. Mustard. Meat flavorings and tenderizers. Bouillon cubes. Hot sauces. Pre-made or packaged marinades. Pre-made or packaged taco seasonings. Relishes. Regular salad dressings. Other foods Salted popcorn and pretzels. The items listed above may not be a complete list of foods and beverages you should avoid. Contact a dietitian for more information. Where to find more information National Heart, Lung, and Blood Institute: PopSteam.is American Heart Association: www.heart.org Academy of Nutrition and Dietetics: www.eatright.org National Kidney Foundation: www.kidney.org Summary The DASH eating plan is a healthy eating plan that has been shown to reduce high blood pressure (hypertension). It may also reduce your risk for type  2 diabetes, heart disease, and stroke. When on the DASH eating plan, aim to eat more fresh fruits and vegetables, whole grains, lean proteins, low-fat dairy, and heart-healthy fats. With the DASH eating plan, you should limit salt (sodium) intake to 2,300 mg a day. If you have hypertension, you may need to reduce your sodium  intake to 1,500 mg a day. Work with your health care provider or dietitian to adjust your eating plan to your individual calorie needs. This information is not intended to replace advice given to you by your health care provider. Make sure you discuss any questions you have with your health care provider. Document Revised: 05/30/2019 Document Reviewed: 05/30/2019 Elsevier Patient Education  2023 [employer]

## 2021-12-05 ENCOUNTER — Other Ambulatory Visit: Payer: Self-pay | Admitting: Family

## 2021-12-05 DIAGNOSIS — K219 Gastro-esophageal reflux disease without esophagitis: Secondary | ICD-10-CM

## 2021-12-05 DIAGNOSIS — I10 Essential (primary) hypertension: Secondary | ICD-10-CM

## 2021-12-05 DIAGNOSIS — F418 Other specified anxiety disorders: Secondary | ICD-10-CM

## 2021-12-13 ENCOUNTER — Ambulatory Visit (INDEPENDENT_AMBULATORY_CARE_PROVIDER_SITE_OTHER): Payer: 59 | Admitting: Family

## 2021-12-13 ENCOUNTER — Encounter: Payer: Self-pay | Admitting: Family

## 2021-12-13 DIAGNOSIS — I10 Essential (primary) hypertension: Secondary | ICD-10-CM | POA: Diagnosis not present

## 2021-12-13 MED ORDER — HYDRALAZINE HCL 50 MG PO TABS: 50.0000 mg | ORAL_TABLET | Freq: Three times a day (TID) | ORAL | 0 refills | Status: DC

## 2021-12-13 NOTE — Progress Notes (Signed)
Provider: Richarda Blade FNP-C  Lance Galas, Donalee Citrin, NP  Patient Care Team: Christan Defranco, Donalee Citrin, NP as PCP - General (Family Medicine) Corbin Ade, MD (Gastroenterology)  Extended Emergency Contact Information Primary Emergency Contact: Dillard,Katrina Address: [address]          [place], [place] [address] [place] Home Phone: [phone] Relation: Sister  Code Status:  Full Code  Goals of care: Advanced Directive information    12/13/2021   11:35 AM  Advanced Directives  Does Patient Have a Medical Advance Directive? No  Would patient like information on creating a medical advance directive? No - Patient declined     Chief Complaint  Patient presents with   Follow-up    Patient is here for 2 WK F/U for elevated BP    HPI:  Pt is a 61 y.o. female seen today for an acute visit for 2 weeks follow-up for high blood pressure.  She was here on 11/28/2021 blood pressure was 148/100 she was advised to take her losartan 100 mg at bedtime and hydralazine was increased from 10 mg 3 times daily to 25 mg 3 times daily.  She was advised to check blood pressure at home and record on log provided and notify provider if blood pressure is greater than 140/90.  Also advised to follow-up in 2 weeks.  She brought in her blood pressure log today blood pressures have improved ranging mostly in 150s over 80s to 190s over 100's but most recent ones have been normal ranging in the 130s over 80s to 140s over 80s.  Heart rate in the 50s to 60s.denies any headache,dizziness,vision changes,fatigue,chest tightness,palpitation,chest pain or shortness of breath.    States was out last week traveling to [facility] for medication and was more relaxed.  Family is expecting another grandbaby in September 2023 which she is looking forward.  Patient congratulated. Has had 7 pounds weight loss since last visit.  Advised to continue with exercise and dietary modification. Discussed increasing  hydralazine from 25 to 50 mg 3 times daily.  States has some hydralazine that was given by her father that he was not using.  Has 2 bottles of 50 mg which she has been titrated in half.  Would like to use current medication until this gone prior to refilling hydralazine.    Past Medical History:  Diagnosis Date   Acute pancreatitis 04/10/2011   ?secondary to HCTZ.    Anxiety    per new patient form   Arthritis    Asthma    Complication of anesthesia    pt. states that with previous surgery, she was difficult to get to sleep   GERD (gastroesophageal reflux disease)    H/O hiatal hernia    Headache    history of migraines   High cholesterol    Per new patient form   History of depression 07/10/2006   History of pneumonia 07/11/1995   Hyperlipidemia    stopped statins due to muscle pain   Hypertension    Hypothyroidism    pt. 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 Jeani Hawking. Per pt, colon polyps and due in five years for f/u.   DG  BONE DENSITY ([facility] HX)  2020   Per new patient form   DIAGNOSTIC MAMMOGRAM  2021   Per new patient form   ESOPHAGOGASTRODUODENOSCOPY  1990s   duodenitis per patient   ESOPHAGOGASTRODUODENOSCOPY  05/24/2011   Procedure: ESOPHAGOGASTRODUODENOSCOPY (EGD);  Surgeon: Corbin Ade, MD;  Location[facility]O [facility];  Service: Endoscopy;  Laterality: N/A;  1:05   REPLACEMENT TOTAL KNEE Left 2021   Per new patient form   TONSILLECTOMY   1984   TOTAL ABDOMINAL HYSTERECTOMY  1991   TRIGGER FINGER RELEASE Right 2015   thumb    Allergies  Allergen Reactions   Percocet [Oxycodone-Acetaminophen] Itching    Terrible itching   Codeine Itching    Bad itching    Outpatient Encounter Medications as of 12/13/2021  Medication Sig   albuterol (VENTOLIN HFA) 108 (90 Base) MCG/ACT inhaler Inhale 2 puffs into the lungs every 6 (six) hours as needed for wheezing or shortness of breath.   atorvastatin (LIPITOR) 10 MG tablet Take 1 tablet (10 mg total) by mouth daily.   b complex vitamins capsule Take 1 capsule by mouth daily.   busPIRone (BUSPAR) 5 MG tablet Take 1 tablet (5 mg total) by mouth 2 (two) times daily.   dicyclomine (BENTYL) 10 MG capsule Take 1 capsule (10 mg total) by mouth 3 (three) times daily before meals.   ergocalciferol (VITAMIN D2) 1.25 MG (50000 UT) capsule TAKE 1 CAPSULE BY MOUTH TWICE A WEEK.   hydrochlorothiazide (MICROZIDE) 12.5 MG capsule Take 1 capsule (12.5 mg total) by mouth daily.   Ipratropium-Albuterol (DUONEB IN) Inhale 1 Inhaler into the lungs.   losartan (COZAAR) 100 MG tablet Take 1 tablet by mouth once daily   metoprolol succinate (TOPROL-XL) 100 MG 24 hr tablet Take 1 tablet by mouth once daily   Omega-3 Fatty Acids (FISH OIL PO) Take by mouth.   pantoprazole (PROTONIX) 40 MG tablet Take 1 tablet by mouth once daily   sertraline (ZOLOFT) 50 MG tablet Take 1 tablet by mouth once daily   [DISCONTINUED] hydrALAZINE (APRESOLINE) 25 MG tablet Take 1 tablet (25 mg total) by mouth 3 (three) times daily.   hydrALAZINE (APRESOLINE) 50 MG tablet Take 1 tablet (50 mg total) by mouth 3 (three) times daily.   potassium chloride SA (KLOR-CON M) 20 MEQ tablet Take 2 tablets (40 mEq total) by mouth daily for 3 days.   No facility-administered encounter medications on file as of 12/13/2021.    Review of Systems  Constitutional:  Negative for appetite change, chills, fatigue, fever and unexpected weight change.   Eyes:  Negative for pain, discharge, redness, itching and visual disturbance.  Respiratory:  Negative for cough, chest tightness, shortness of breath and wheezing.   Cardiovascular:  Negative for chest pain, palpitations and leg swelling.  Gastrointestinal:  Negative for abdominal distention, abdominal pain, constipation, nausea and vomiting.  Musculoskeletal:  Negative for arthralgias, back pain, gait problem, joint swelling, myalgias, neck pain and neck stiffness.  Skin:  Negative for color change, pallor, rash and wound.  Neurological:  Negative for dizziness, speech difficulty, weakness, light-headedness, numbness and headaches.  Hematological:  Does not bruise/bleed easily.  Psychiatric/Behavioral:  Negative for agitation, behavioral problems and sleep disturbance. The patient  is not nervous/anxious.    Immunization History  Administered Date(s) Administered   Influenza Split 04/10/2016, 04/18/2018   Influenza-Unspecified 04/24/2017, 04/15/2019, 04/29/2020, 04/28/2021   Moderna Sars-Covid-2 Vaccination 09/25/2019, 10/23/2019   Pfizer Covid-19 Vaccine Bivalent Booster 2776yrs & up 11/30/2020   Tdap 07/11/2011   Pertinent  Health Maintenance Due  Topic Date Due   PAP SMEAR-Modifier  Never done   COLONOSCOPY (Pts 45-385yrs Insurance coverage will need to be confirmed)  Never done   INFLUENZA VACCINE  02/07/2022   MAMMOGRAM  11/11/2023      05/26/2021    6:47 PM 09/06/2021    1:23 PM 09/21/2021    8:20 AM 09/27/2021    3:48 PM 10/26/2021    2:21 PM  Fall Risk  Falls in the past year?  0 0 0 0  Was there an injury with Fall?  0 0 0 0  Fall Risk Category Calculator  0 0 0 0  Fall Risk Category  Low Low Low Low  Patient Fall Risk Level Low fall risk Low fall risk Low fall risk Low fall risk Low fall risk  Patient at Risk for Falls Due to  No Fall Risks No Fall Risks No Fall Risks No Fall Risks  Fall risk Follow up  Falls evaluation completed Falls evaluation completed Falls evaluation  completed Falls evaluation completed   Functional Status Survey:    Vitals:   12/13/21 1353  BP: (!) 142/84  Pulse: 75  Resp: 18  Temp: 98.2 F (36.8 C)  TempSrc: Temporal  SpO2: 96%  Weight: 228 lb (103.4 kg)  Height: 5\' 3"  (1.6 m)   Body mass index is 40.39 kg/m. Physical Exam Vitals reviewed.  Constitutional:      General: She is not in acute distress.    Appearance: Normal appearance. She is obese. She is not ill-appearing or diaphoretic.  HENT:     Head: Normocephalic.  Eyes:     General: No scleral icterus.       Right eye: No discharge.        Left eye: No discharge.     Conjunctiva/sclera: Conjunctivae normal.     Pupils: Pupils are equal, round, and reactive to light.  Neck:     Vascular: No carotid bruit.  Cardiovascular:     Rate and Rhythm: Normal rate and regular rhythm.     Pulses: Normal pulses.     Heart sounds: Normal heart sounds. No murmur heard.   No friction rub. No gallop.  Pulmonary:     Effort: Pulmonary effort is normal. No respiratory distress.     Breath sounds: Normal breath sounds. No wheezing, rhonchi or rales.  Chest:     Chest wall: No tenderness.  Abdominal:     General: Bowel sounds are normal. There is no distension.     Palpations: Abdomen is soft. There is no mass.     Tenderness: There is no abdominal tenderness. There is no right CVA tenderness, left CVA tenderness, guarding or rebound.  Musculoskeletal:        General: No swelling or tenderness. Normal range of motion.     Cervical back: Normal range of motion. No rigidity or tenderness.     Right lower leg: No edema.     Left lower leg: No edema.  Lymphadenopathy:     Cervical: No cervical adenopathy.  Skin:    General: Skin is warm and dry.     Coloration: Skin is not pale.     Findings:  No bruising, erythema, lesion or rash.  Neurological:     Mental Status: She is alert and oriented to person, place, and time.     Motor: No weakness.     Gait: Gait normal.   Psychiatric:        Mood and Affect: Mood normal.        Speech: Speech normal.        Behavior: Behavior normal.    Labs reviewed: Recent Labs    09/21/21 0908  NA 143  K 3.4*  CL 101  CO2 30  GLUCOSE 107*  BUN 13  CREATININE 0.94  CALCIUM 9.8    Recent Labs    09/21/21 0908  AST 28  ALT 23  BILITOT 0.9  PROT 7.0    Recent Labs    09/21/21 0908  WBC 8.1  NEUTROABS 5,370  HGB 13.5  HCT 40.2  MCV 93.5  PLT 258    Lab Results  Component Value Date   TSH 3.52 09/21/2021   No results found for: HGBA1C Lab Results  Component Value Date   CHOL 286 (H) 09/21/2021   HDL 67 09/21/2021   LDLCALC 195 (H) 09/21/2021   TRIG 117 09/21/2021   CHOLHDL 4.3 09/21/2021    Significant Diagnostic Results in last 30 days:  No results found.  Assessment/Plan  Essential hypertension  Blood pressure has improved but not at goal Discussed increasing hydralazine from 25 mg 3 times daily to 50 mg 3 times daily No prescription sent to pharmacy patient stated has hydralazine 50 mg that was given by the father since his hydralazine was recently discontinued.  Patient would like to use up his hydralazine then will request for refills. - Advised to check Blood pressure at home and record on log provided and notify provider if B/p > 140/90. Advised to blood blood pressure log every 2 weeks for provider to evaluate on MyChart.  Patient and family how to use MyChart. - hydrALAZINE (APRESOLINE) 50 MG tablet; Take 1 tablet (50 mg total) by mouth 3 (three) times daily.  Dispense: 270 tablet; Refill: 0  Family/ staff Communication: Reviewed plan of care with patient verbalized understanding  Labs/tests ordered: None   Next Appointment: Return in about 4 months (around 04/14/2022) for medical mangement of chronic issues.. Work  Caesar Bookman, NP

## 2022-01-09 NOTE — Telephone Encounter (Signed)
Blood pressure readings reviewed still high increase hydrochlorothiazide from 12.5 mg tablet to 25 mg tablet one by mouth daily.  - schedule follow up appointment in 2 weeks to reevaluate blood pressure.

## 2022-01-27 ENCOUNTER — Other Ambulatory Visit: Payer: 59

## 2022-01-27 DIAGNOSIS — E782 Mixed hyperlipidemia: Secondary | ICD-10-CM

## 2022-01-27 DIAGNOSIS — R739 Hyperglycemia, unspecified: Secondary | ICD-10-CM

## 2022-01-27 DIAGNOSIS — I10 Essential (primary) hypertension: Secondary | ICD-10-CM

## 2022-02-23 ENCOUNTER — Other Ambulatory Visit: Payer: Self-pay | Admitting: Family

## 2022-02-28 ENCOUNTER — Other Ambulatory Visit: Payer: Self-pay | Admitting: Family

## 2022-02-28 DIAGNOSIS — K582 Mixed irritable bowel syndrome: Secondary | ICD-10-CM

## 2022-03-01 ENCOUNTER — Other Ambulatory Visit: Payer: Self-pay

## 2022-03-01 DIAGNOSIS — I10 Essential (primary) hypertension: Secondary | ICD-10-CM

## 2022-03-01 MED ORDER — HYDRALAZINE HCL 50 MG PO TABS: 50.0000 mg | ORAL_TABLET | Freq: Three times a day (TID) | ORAL | 1 refills | Status: DC

## 2022-03-06 ENCOUNTER — Ambulatory Visit: Admit: 2022-03-06 | Payer: 59 | Admitting: Ophthalmology

## 2022-03-06 SURGERY — PHACOEMULSIFICATION, CATARACT, WITH IOL INSERTION
Anesthesia: Topical | Laterality: Right

## 2022-03-19 ENCOUNTER — Other Ambulatory Visit: Payer: Self-pay | Admitting: Family

## 2022-03-19 DIAGNOSIS — F418 Other specified anxiety disorders: Secondary | ICD-10-CM

## 2022-03-20 ENCOUNTER — Ambulatory Visit: Admit: 2022-03-20 | Payer: 59 | Admitting: Ophthalmology

## 2022-03-20 SURGERY — PHACOEMULSIFICATION, CATARACT, WITH IOL INSERTION
Anesthesia: Topical | Laterality: Left

## 2022-03-23 ENCOUNTER — Telehealth: Payer: Self-pay | Admitting: *Deleted

## 2022-03-23 NOTE — Telephone Encounter (Signed)
Prior authorization signed and faxed

## 2022-03-23 NOTE — Telephone Encounter (Signed)
Received fax from [facility] for Prior Authorization for Pantoprazole.   Filled out and placed in Joan Salazar's folder to review and sign.   To be faxed back to [facility] once completed to Fax:[phone]

## 2022-04-11 ENCOUNTER — Encounter: Payer: Self-pay | Admitting: Ophthalmology

## 2022-04-14 ENCOUNTER — Encounter: Payer: 59 | Admitting: Family

## 2022-04-14 NOTE — Discharge Instructions (Signed)

## 2022-04-16 NOTE — Progress Notes (Signed)
  This encounter was created in error - please disregard. No show 

## 2022-04-17 ENCOUNTER — Ambulatory Visit: Payer: Self-pay | Admitting: Anesthesiology

## 2022-04-17 ENCOUNTER — Ambulatory Visit
Admission: RE | Admit: 2022-04-17 | Discharge: 2022-04-17 | Disposition: A | Discharge status: Home / Self Care | Payer: Self-pay | Attending: Ophthalmology | Admitting: Ophthalmology

## 2022-04-17 ENCOUNTER — Encounter: Payer: Self-pay | Admitting: Ophthalmology

## 2022-04-17 ENCOUNTER — Encounter
Admission: RE | Disposition: A | Discharge status: Home / Self Care | Payer: Self-pay | Source: Home / Self Care | Attending: Ophthalmology

## 2022-04-17 ENCOUNTER — Other Ambulatory Visit: Payer: Self-pay

## 2022-04-17 DIAGNOSIS — H2511 Age-related nuclear cataract, right eye: Secondary | ICD-10-CM | POA: Insufficient documentation

## 2022-04-17 DIAGNOSIS — Z6839 Body mass index (BMI) 39.0-39.9, adult: Secondary | ICD-10-CM | POA: Insufficient documentation

## 2022-04-17 DIAGNOSIS — E039 Hypothyroidism, unspecified: Secondary | ICD-10-CM | POA: Insufficient documentation

## 2022-04-17 DIAGNOSIS — Z79899 Other long term (current) drug therapy: Secondary | ICD-10-CM | POA: Insufficient documentation

## 2022-04-17 DIAGNOSIS — F419 Anxiety disorder, unspecified: Secondary | ICD-10-CM | POA: Insufficient documentation

## 2022-04-17 DIAGNOSIS — M1990 Unspecified osteoarthritis, unspecified site: Secondary | ICD-10-CM | POA: Insufficient documentation

## 2022-04-17 DIAGNOSIS — E785 Hyperlipidemia, unspecified: Secondary | ICD-10-CM | POA: Insufficient documentation

## 2022-04-17 DIAGNOSIS — Z981 Arthrodesis status: Secondary | ICD-10-CM | POA: Insufficient documentation

## 2022-04-17 DIAGNOSIS — K219 Gastro-esophageal reflux disease without esophagitis: Secondary | ICD-10-CM | POA: Insufficient documentation

## 2022-04-17 DIAGNOSIS — I10 Essential (primary) hypertension: Secondary | ICD-10-CM | POA: Insufficient documentation

## 2022-04-17 DIAGNOSIS — E7800 Pure hypercholesterolemia, unspecified: Secondary | ICD-10-CM | POA: Insufficient documentation

## 2022-04-17 DIAGNOSIS — J45909 Unspecified asthma, uncomplicated: Secondary | ICD-10-CM | POA: Insufficient documentation

## 2022-04-17 HISTORY — PX: CATARACT EXTRACTION W/PHACO: SHX586

## 2022-04-17 SURGERY — PHACOEMULSIFICATION, CATARACT, WITH IOL INSERTION
Anesthesia: Monitor Anesthesia Care | Site: Eye | Laterality: Right

## 2022-04-17 MED ORDER — TETRACAINE HCL 0.5 % OP SOLN
1.0000 [drp] | OPHTHALMIC | Status: DC | PRN
  Administered 2022-04-17 (×3): 1 [drp] via OPHTHALMIC

## 2022-04-17 MED ORDER — MIDAZOLAM HCL 2 MG/2ML IJ SOLN
INTRAMUSCULAR | Status: DC | PRN
  Administered 2022-04-17: 2 mg via INTRAVENOUS

## 2022-04-17 MED ORDER — ARMC OPHTHALMIC DILATING DROPS
1.0000 | OPHTHALMIC | Status: DC | PRN
Start: 2022-04-17 — End: 2022-04-17
  Administered 2022-04-17 (×3): 1 via OPHTHALMIC

## 2022-04-17 MED ORDER — MOXIFLOXACIN HCL 0.5 % OP SOLN
OPHTHALMIC | Status: DC | PRN
  Administered 2022-04-17: 0.2 mL via OPHTHALMIC

## 2022-04-17 MED ORDER — FENTANYL CITRATE (PF) 100 MCG/2ML IJ SOLN
INTRAMUSCULAR | Status: DC | PRN
  Administered 2022-04-17 (×2): 50 ug via INTRAVENOUS

## 2022-04-17 MED ORDER — SIGHTPATH DOSE#1 NA HYALUR & NA CHOND-NA HYALUR IO KIT
PACK | INTRAOCULAR | Status: DC | PRN
  Administered 2022-04-17: 1 via OPHTHALMIC

## 2022-04-17 MED ORDER — SIGHTPATH DOSE#1 BSS IO SOLN
INTRAOCULAR | Status: DC | PRN
  Administered 2022-04-17: 84 mL via OPHTHALMIC

## 2022-04-17 MED ORDER — LIDOCAINE HCL (PF) 2 % IJ SOLN
INTRAOCULAR | Status: DC | PRN
  Administered 2022-04-17: 1 mL via INTRAOCULAR

## 2022-04-17 MED ORDER — SIGHTPATH DOSE#1 BSS IO SOLN
INTRAOCULAR | Status: DC | PRN
  Administered 2022-04-17: 15 mL

## 2022-04-17 SURGICAL SUPPLY — 19 items
CANNULA ANT/CHMB 27G (MISCELLANEOUS) IMPLANT
CANNULA ANT/CHMB 27GA (MISCELLANEOUS) IMPLANT
CATARACT SUITE SIGHTPATH (MISCELLANEOUS) ×1 IMPLANT
DISSECTOR HYDRO NUCLEUS 50X22 (MISCELLANEOUS) ×1 IMPLANT
FEE CATARACT SUITE SIGHTPATH (MISCELLANEOUS) ×1 IMPLANT
GLOVE SURG GAMMEX PI TX LF 7.5 (GLOVE) ×1 IMPLANT
GLOVE SURG SYN 8.5  E (GLOVE) ×1
GLOVE SURG SYN 8.5 E (GLOVE) ×1 IMPLANT
GLOVE SURG SYN 8.5 PF PI (GLOVE) ×1 IMPLANT
LENS IOL TECNIS EYHANCE 20.5 (Intraocular Lens) IMPLANT
NDL FILTER BLUNT 18X1 1/2 (NEEDLE) ×1 IMPLANT
NEEDLE FILTER BLUNT 18X1 1/2 (NEEDLE) ×1 IMPLANT
PACK VIT ANT 23G (MISCELLANEOUS) IMPLANT
RING MALYGIN (MISCELLANEOUS) IMPLANT
SUT ETHILON 10-0 CS-B-6CS-B-6 (SUTURE)
SUTURE EHLN 10-0 CS-B-6CS-B-6 (SUTURE) IMPLANT
SYR 3ML LL SCALE MARK (SYRINGE) ×1 IMPLANT
SYR 5ML LL (SYRINGE) ×1 IMPLANT
WATER STERILE IRR 250ML POUR (IV SOLUTION) ×1 IMPLANT

## 2022-04-17 NOTE — Op Note (Signed)
OPERATIVE NOTE  Joan Salazar [id number] 04/17/2022   PREOPERATIVE DIAGNOSIS:  Nuclear sclerotic cataract right eye.  H25.11   POSTOPERATIVE DIAGNOSIS:    Nuclear sclerotic cataract right eye.     PROCEDURE:  Phacoemusification with posterior chamber intraocular lens placement of the right eye   LENS:   Implant Name Type Inv. Item Serial No. Manufacturer Lot No. LRB No. Used Action  LENS IOL TECNIS EYHANCE 20.5 - [id number] Intraocular Lens LENS IOL TECNIS EYHANCE 20.5 [id number] [employer]  Right 1 Implanted       Procedure(s) with comments: CATARACT EXTRACTION PHACO AND INTRAOCULAR LENS PLACEMENT (IOC) RIGHT (Right) - 5.68 0:38.1  DIB00 +20.5   ULTRASOUND TIME: 0 minutes 38 seconds.  CDE 5.68   SURGEON:  Benay Pillow, MD, MPH  ANESTHESIOLOGIST: Anesthesiologist: Ilene Qua, MD CRNA: Moises Blood, CRNA   ANESTHESIA:  Topical with tetracaine drops augmented with 1% preservative-free intracameral lidocaine.  ESTIMATED BLOOD LOSS: less than 1 mL.   COMPLICATIONS:  None.   DESCRIPTION OF PROCEDURE:  The patient was identified in the holding room and transported to the operating room and placed in the supine position under the operating microscope.  The right eye was identified as the operative eye and it was prepped and draped in the usual sterile ophthalmic fashion.   A 1.0 millimeter clear-corneal paracentesis was made at the 10:30 position. 0.5 ml of preservative-free 1% lidocaine with epinephrine was injected into the anterior chamber.  The anterior chamber was filled with viscoelastic.  A 2.4 millimeter keratome was used to make a near-clear corneal incision at the 8:00 position.  A curvilinear capsulorrhexis was made with a cystotome and capsulorrhexis forceps.  Balanced salt solution was used to hydrodissect and hydrodelineate the nucleus.   Phacoemulsification was then used in stop and chop fashion to remove the lens nucleus and epinucleus.  The remaining cortex was  then removed using the irrigation and aspiration handpiece. Viscoelastic was then placed into the capsular bag to distend it for lens placement.  A lens was then injected into the capsular bag.  The remaining viscoelastic was aspirated.   Wounds were hydrated with balanced salt solution.  The anterior chamber was inflated to a physiologic pressure with balanced salt solution.   Intracameral vigamox 0.1 mL undiluted was injected into the eye and a drop placed onto the ocular surface.  No wound leaks were noted.  The patient was taken to the recovery room in stable condition without complications of anesthesia or surgery  Benay Pillow 04/17/2022, 12:10 PM

## 2022-04-17 NOTE — Anesthesia Preprocedure Evaluation (Signed)
Anesthesia Evaluation  Patient identified by MRN, date of birth, ID band Patient awake    Reviewed: Allergy & Precautions, NPO status , Patient's Chart, lab work & pertinent test results, reviewed documented beta blocker date and time   History of Anesthesia Complications (+) PONV and history of anesthetic complications  Airway Mallampati: III  TM Distance: >3 FB Neck ROM: full    Dental  (+) Teeth Intact   Pulmonary asthma ,    Pulmonary exam normal        Cardiovascular hypertension, Pt. on medications and Pt. on home beta blockers Normal cardiovascular exam     Neuro/Psych  Headaches, PSYCHIATRIC DISORDERS Anxiety    GI/Hepatic Neg liver ROS, hiatal hernia, GERD  ,  Endo/Other  Morbid obesity  Renal/GU negative Renal ROS  negative genitourinary   Musculoskeletal  (+) Arthritis , Osteoarthritis,    Abdominal   Peds negative pediatric ROS (+)  Hematology negative hematology ROS (+)   Anesthesia Other Findings Past Medical History: 04/10/2011: Acute pancreatitis     Comment:  ?secondary to HCTZ.  No date: Anxiety     Comment:  per new patient form No date: Arthritis No date: Asthma No date: Complication of anesthesia     Comment:  pt. states that with previous surgery, she was difficult              to get to sleep No date: GERD (gastroesophageal reflux disease) No date: H/O hiatal hernia No date: Headache     Comment:  history of migraines No date: High cholesterol     Comment:  Per new patient form 07/10/2006: History of depression 07/11/1995: History of pneumonia No date: Hyperlipidemia     Comment:  stopped statins due to muscle pain No date: Hypertension No date: Hypothyroidism     Comment:  pt. 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 Carol Ada. Per pt, colon polyps and due in five               years for f/u. 2020: DG  BONE DENSITY ([facility] HX)     Comment:  Per new patient form 2021: DIAGNOSTIC MAMMOGRAM     Comment:  Per new patient form 1990s: ESOPHAGOGASTRODUODENOSCOPY     Comment:  duodenitis per patient 05/24/2011: ESOPHAGOGASTRODUODENOSCOPY     Comment:  Procedure: ESOPHAGOGASTRODUODENOSCOPY (EGD);  Surgeon:               Daneil Dolin, MD;  Location: [facility];  Service:               Endoscopy;  Laterality: N/A;  1:05 2021: REPLACEMENT TOTAL KNEE; Left     Comment:  Per new patient form 1984: TONSILLECTOMY 1991: TOTAL ABDOMINAL HYSTERECTOMY 2015:  TRIGGER FINGER RELEASE; Right     Comment:  thumb  BMI    Body Mass Index: 39.86 kg/m      Reproductive/Obstetrics negative OB ROS                            Anesthesia Physical Anesthesia Plan  ASA: 3  Anesthesia Plan: MAC   Post-op Pain Management: Minimal or no pain anticipated   Induction: Intravenous  PONV Risk Score and Plan:   Airway Management Planned:   Additional Equipment:   Intra-op Plan:   Post-operative Plan: Extubation in OR  Informed Consent: I have reviewed the patients History and Physical, chart, labs and discussed the procedure including the risks, benefits and alternatives for the proposed anesthesia with the patient or authorized representative who has indicated his/her understanding and acceptance.     Dental Advisory Given  Plan Discussed with:  Anesthesiologist, CRNA and Surgeon  Anesthesia Plan Comments: (Patient consented for risks of anesthesia including but not limited to:  - adverse reactions to medications - damage to eyes, teeth, lips or other oral mucosa - nerve damage due to positioning  - sore throat or hoarseness - Damage to heart, brain, nerves, lungs, other parts of body or loss of life  Patient voiced understanding.)        Anesthesia Quick Evaluation

## 2022-04-17 NOTE — Transfer of Care (Signed)
Immediate Anesthesia Transfer of Care Note  Patient: Joan Salazar  Procedure(s) Performed: CATARACT EXTRACTION PHACO AND INTRAOCULAR LENS PLACEMENT (IOC) RIGHT (Right: Eye)  Patient Location: PACU  Anesthesia Type: MAC  Level of Consciousness: awake, alert  and patient cooperative  Airway and Oxygen Therapy: Patient Spontanous Breathing and Patient connected to supplemental oxygen  Post-op Assessment: Post-op Vital signs reviewed, Patient's Cardiovascular Status Stable, Respiratory Function Stable, Patent Airway and No signs of Nausea or vomiting  Post-op Vital Signs: Reviewed and stable  Complications: No notable events documented.

## 2022-04-17 NOTE — Anesthesia Postprocedure Evaluation (Signed)
Anesthesia Post Note  Patient: Joan Salazar  Procedure(s) Performed: CATARACT EXTRACTION PHACO AND INTRAOCULAR LENS PLACEMENT (IOC) RIGHT (Right: Eye)  Patient location during evaluation: PACU Anesthesia Type: MAC Level of consciousness: awake and alert Pain management: pain level controlled Vital Signs Assessment: post-procedure vital signs reviewed and stable Respiratory status: spontaneous breathing, nonlabored ventilation, respiratory function stable and patient connected to nasal cannula oxygen Cardiovascular status: stable and blood pressure returned to baseline Postop Assessment: no apparent nausea or vomiting Anesthetic complications: no   No notable events documented.   Last Vitals:  Vitals:   04/17/22 1212 04/17/22 1215  BP: 122/72 117/70  Pulse: (!) 57 61  Resp: 20 19  Temp: (!) 36.4 C (!) 36.4 C  SpO2: 95% 93%    Last Pain:  Vitals:   04/17/22 1215  TempSrc:   PainSc: 0-No pain                 Ilene Qua

## 2022-04-17 NOTE — H&P (Signed)
[facility]   Primary Care Physician:  Pcp, No Ophthalmologist: Dr. Willey Blade  Pre-Procedure History & Physical: HPI:  Joan Salazar is a 61 y.o. female here for cataract surgery.   Past Medical History:  Diagnosis Date   Acute pancreatitis 04/10/2011   ?secondary to HCTZ.    Anxiety    per new patient form   Arthritis    Asthma    Complication of anesthesia    pt. states that with previous surgery, she was difficult to get to sleep   GERD (gastroesophageal reflux disease)    H/O hiatal hernia    Headache    history of migraines   High cholesterol    Per new patient form   History of depression 07/10/2006   History of pneumonia 07/11/1995   Hyperlipidemia    stopped statins due to muscle pain   Hypertension    Hypothyroidism    pt. 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 Jeani Hawking. Per pt, colon polyps and due in five years for f/u.   DG  BONE DENSITY ([facility] HX)  2020   Per new patient form   DIAGNOSTIC MAMMOGRAM  2021   Per new patient form   ESOPHAGOGASTRODUODENOSCOPY  1990s   duodenitis per patient   ESOPHAGOGASTRODUODENOSCOPY  05/24/2011   Procedure: ESOPHAGOGASTRODUODENOSCOPY (EGD);  Surgeon: Corbin Ade, MD;  Location: [facility];  Service: Endoscopy;  Laterality: N/A;  1:05   REPLACEMENT TOTAL KNEE Left 2021   Per new patient form   TONSILLECTOMY  1984   TOTAL ABDOMINAL HYSTERECTOMY  1991   TRIGGER FINGER RELEASE Right  2015   thumb    Prior to Admission medications   Medication Sig Start Date End Date Taking? Authorizing Provider  albuterol (VENTOLIN HFA) 108 (90 Base) MCG/ACT inhaler Inhale 2 puffs into the lungs every 6 (six) hours as needed for wheezing or shortness of breath.   Yes [provider]  busPIRone (BUSPAR) 5 MG tablet Take 1 tablet (5 mg total) by mouth 2 (two) times daily. 10/10/21  Yes Ngetich, Dinah C, NP  diphenhydrAMINE (SOMINEX) 25 MG tablet Take 25-50 mg by mouth at bedtime as needed for sleep.   Yes [provider]  ergocalciferol (VITAMIN D2) 1.25 MG (50000 UT) capsule TAKE 1 CAPSULE BY MOUTH TWICE A WEEK. 11/28/21  Yes Ngetich, Dinah C, NP  esomeprazole (NEXIUM) 20 MG capsule Take 20 mg by mouth daily at 12 noon.   Yes [provider]  hydrALAZINE (APRESOLINE) 50 MG tablet Take 1 tablet (50 mg total) by mouth 3 (three) times daily. Patient taking differently: Take 50 mg by mouth in the morning and at bedtime. 03/01/22 08/28/22 Yes Ngetich, Dinah C, NP  hydrochlorothiazide (MICROZIDE) 12.5 MG capsule Take 1 capsule by mouth once daily 02/24/22  Yes Ngetich, Dinah C, NP  Ipratropium-Albuterol (DUONEB IN) Inhale 1 Inhaler into the lungs.   Yes [provider]  Loperamide HCl (IMODIUM PO) Take by mouth as needed.   Yes [provider]  losartan (COZAAR) 100 MG tablet Take 1 tablet by mouth once daily 12/06/21  Yes Ngetich, Dinah C, NP  Melatonin 12 MG TABS Take by mouth at bedtime as needed.   Yes [provider]  metoprolol succinate (TOPROL-XL) 100 MG 24 hr tablet Take 1 tablet by mouth once daily 12/06/21  Yes Ngetich, Dinah C, NP  Omega-3 Fatty Acids (FISH OIL PO) Take by mouth.   Yes [provider]  sertraline (ZOLOFT) 50 MG tablet Take 1 tablet by mouth once daily 03/20/22  Yes Eubanks, Janene Harvey, NP  dicyclomine (BENTYL) 10 MG capsule TAKE 1 CAPSULE BY MOUTH THREE TIMES DAILY BEFORE MEAL(S) Patient not taking: Reported on  04/11/2022 02/28/22   Ngetich, Dinah C, NP    Allergies as of 03/22/2022 - Review Complete 12/13/2021  Allergen Reaction Noted   Percocet [oxycodone-acetaminophen] Itching 05/20/2015   Codeine Itching 05/18/2011    Family History  Problem Relation Age of Onset   Hypertension Mother    Heart attack Mother        2008   Ulcerative colitis Mother    Arthritis Mother    Arthritis Father    Cancer Father    Hypertension Father    Hyperlipidemia Father    Cancer Sister    Hypertension Sister    High Cholesterol Sister    Hypertension Sister    Diverticulosis Sister    High Cholesterol Brother    Hypertension Brother    Cancer Maternal Grandmother    Stroke Maternal Grandfather        1984   Breast cancer Paternal Grandmother        >50   Hypertension Son    Diverticulosis Son    Dementia Maternal Aunt    Dementia Maternal Uncle    Colon cancer Neg Hx    Liver disease Neg Hx     Social History   Socioeconomic History   Marital status: Divorced    Spouse name: Not on file   Number of children: 1   Years of education: Not on file   Highest education level: Not on file  Occupational History   Occupation: Charity fundraiser DIRECTOR  CLINICAL EDUCATION    Employer: [employer]    Comment: [employer]  Tobacco Use   Smoking status: Never   Smokeless tobacco: Never  Vaping Use   Vaping Use: Never used  Substance and Sexual Activity   Alcohol use: No   Drug use: No   Sexual activity: Not on file  Other Topics Concern   Not on file  Social History Narrative   Diet: Did not answer      Caffeine: Yes, tea and coffee      Married, if yes what year: Divorced       Do you live in a house, apartment, assisted living, condo, trailer, ect: House      Is it one or more stories: 2      How many persons live in your home? 1      Pets: No      Highest level or education completed: Associate Degree      Current/Past profession: RN      Exercise:  No              Type and how often:          Living Will: No   DNR: no If  not, do you wish to discuss one? Yes   POA/HPOA: No      Functional Status:   Do you have difficulty bathing or dressing yourself? No   Do you have difficulty preparing food or eating? No   Do you have difficulty managing your medications? No   Do you have difficulty managing your finances? No   Do you have difficulty affording your medications? No       Social Determinants of Radio broadcast assistant Strain: Not on file  Food Insecurity: Not on file  Transportation Needs: Not on file  Physical Activity: Not on file  Stress: Not on file  Social Connections: Not on file  Intimate Partner Violence: Not on file    Review of Systems: See HPI, otherwise negative ROS  Physical Exam: BP (!) 145/72   Pulse 67   Temp 97.6 F (36.4 C) (Temporal)   Ht 5\' 3"  (1.6 m)   Wt 102.1 kg   SpO2 97%   BMI 39.86 kg/m  General:   Alert, cooperative in NAD Head:  Normocephalic and atraumatic. Respiratory:  Normal work of breathing. Cardiovascular:  RRR  Impression/Plan: Cathey Fredenburg is here for cataract surgery.  Risks, benefits, limitations, and alternatives regarding cataract surgery have been reviewed with the patient.  Questions have been answered.  All parties agreeable.   Benay Pillow, MD  04/17/2022, 11:43 AM

## 2022-04-18 ENCOUNTER — Encounter: Payer: Self-pay | Admitting: Ophthalmology

## 2022-04-28 NOTE — Anesthesia Preprocedure Evaluation (Signed)
Anesthesia Evaluation  Patient identified by MRN, date of birth, ID band Patient awake    Reviewed: Allergy & Precautions, NPO status , Patient's Chart, lab work & pertinent test results, reviewed documented beta blocker date and time   History of Anesthesia Complications (+) PONV and history of anesthetic complications  Airway Mallampati: III  TM Distance: >3 FB Neck ROM: full    Dental  (+) Teeth Intact   Pulmonary asthma ,    Pulmonary exam normal        Cardiovascular hypertension, Pt. on medications and Pt. on home beta blockers Normal cardiovascular exam     Neuro/Psych  Headaches, PSYCHIATRIC DISORDERS Anxiety    GI/Hepatic Neg liver ROS, hiatal hernia, GERD  Controlled,  Endo/Other  Morbid obesity  Renal/GU negative Renal ROS  negative genitourinary   Musculoskeletal  (+) Arthritis , Osteoarthritis,    Abdominal (+) + obese,   Peds negative pediatric ROS (+)  Hematology negative hematology ROS (+)   Anesthesia Other Findings Past Medical History: 04/10/2011: Acute pancreatitis     Comment:  ?secondary to HCTZ.  No date: Anxiety     Comment:  per new patient form No date: Arthritis No date: Asthma No date: Complication of anesthesia     Comment:  pt. states that with previous surgery, she was difficult              to get to sleep No date: GERD (gastroesophageal reflux disease) No date: H/O hiatal hernia No date: Headache     Comment:  history of migraines No date: High cholesterol     Comment:  Per new patient form 07/10/2006: History of depression 07/11/1995: History of pneumonia No date: Hyperlipidemia     Comment:  stopped statins due to muscle pain No date: Hypertension No date: Hypothyroidism     Comment:  pt. 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 Carol Ada. Per pt, colon polyps and due in five               years for f/u. 2020: DG  BONE DENSITY ([facility] HX)     Comment:  Per new patient form 2021: DIAGNOSTIC MAMMOGRAM     Comment:  Per new patient form 1990s: ESOPHAGOGASTRODUODENOSCOPY     Comment:  duodenitis per patient 05/24/2011: ESOPHAGOGASTRODUODENOSCOPY     Comment:  Procedure: ESOPHAGOGASTRODUODENOSCOPY (EGD);  Surgeon:               Daneil Dolin, MD;  Location: [facility];  Service:               Endoscopy;  Laterality: N/A;  1:05 2021: REPLACEMENT TOTAL KNEE; Left     Comment:  Per new patient form 1984: TONSILLECTOMY 1991: TOTAL  ABDOMINAL HYSTERECTOMY 2015: TRIGGER FINGER RELEASE; Right     Comment:  thumb  BMI    Body Mass Index: 39.86 kg/m      Reproductive/Obstetrics negative OB ROS                            Anesthesia Physical  Anesthesia Plan  ASA: 3  Anesthesia Plan: MAC   Post-op Pain Management: Minimal or no pain anticipated   Induction: Intravenous  PONV Risk Score and Plan:   Airway Management Planned:   Additional Equipment:   Intra-op Plan:   Post-operative Plan:   Informed Consent: I have reviewed the patients History and Physical, chart, labs and discussed the procedure including the risks, benefits and alternatives for the proposed anesthesia with the patient or authorized representative who has indicated his/her understanding and acceptance.     Dental Advisory Given  Plan  Discussed with: Anesthesiologist, CRNA and Surgeon  Anesthesia Plan Comments: (Patient consented for risks of anesthesia including but not limited to:  - adverse reactions to medications - damage to eyes, teeth, lips or other oral mucosa - nerve damage due to positioning  - sore throat or hoarseness - Damage to heart, brain, nerves, lungs, other parts of body or loss of life  Patient voiced understanding.)       Anesthesia Quick Evaluation

## 2022-04-28 NOTE — Discharge Instructions (Signed)

## 2022-05-01 ENCOUNTER — Ambulatory Visit: Payer: Self-pay | Admitting: Anesthesiology

## 2022-05-01 ENCOUNTER — Encounter
Admission: RE | Disposition: A | Discharge status: Home / Self Care | Payer: Self-pay | Source: Home / Self Care | Attending: Ophthalmology

## 2022-05-01 ENCOUNTER — Ambulatory Visit
Admission: RE | Admit: 2022-05-01 | Discharge: 2022-05-01 | Disposition: A | Discharge status: Home / Self Care | Payer: Self-pay | Attending: Ophthalmology | Admitting: Ophthalmology

## 2022-05-01 ENCOUNTER — Encounter: Payer: Self-pay | Admitting: Ophthalmology

## 2022-05-01 ENCOUNTER — Other Ambulatory Visit: Payer: Self-pay

## 2022-05-01 DIAGNOSIS — H2512 Age-related nuclear cataract, left eye: Secondary | ICD-10-CM | POA: Insufficient documentation

## 2022-05-01 DIAGNOSIS — Z6841 Body Mass Index (BMI) 40.0 and over, adult: Secondary | ICD-10-CM | POA: Insufficient documentation

## 2022-05-01 DIAGNOSIS — K219 Gastro-esophageal reflux disease without esophagitis: Secondary | ICD-10-CM | POA: Insufficient documentation

## 2022-05-01 DIAGNOSIS — I10 Essential (primary) hypertension: Secondary | ICD-10-CM | POA: Insufficient documentation

## 2022-05-01 DIAGNOSIS — J45909 Unspecified asthma, uncomplicated: Secondary | ICD-10-CM | POA: Insufficient documentation

## 2022-05-01 DIAGNOSIS — K449 Diaphragmatic hernia without obstruction or gangrene: Secondary | ICD-10-CM | POA: Insufficient documentation

## 2022-05-01 HISTORY — PX: CATARACT EXTRACTION W/PHACO: SHX586

## 2022-05-01 SURGERY — PHACOEMULSIFICATION, CATARACT, WITH IOL INSERTION
Anesthesia: Monitor Anesthesia Care | Site: Eye | Laterality: Left

## 2022-05-01 MED ORDER — MOXIFLOXACIN HCL 0.5 % OP SOLN
OPHTHALMIC | Status: DC | PRN
  Administered 2022-05-01: 0.2 mL via OPHTHALMIC

## 2022-05-01 MED ORDER — LACTATED RINGERS IV SOLN: INTRAVENOUS | Status: DC

## 2022-05-01 MED ORDER — FENTANYL CITRATE (PF) 100 MCG/2ML IJ SOLN
INTRAMUSCULAR | Status: DC | PRN
  Administered 2022-05-01: 100 ug via INTRAVENOUS
  Administered 2022-05-01: 50 ug via INTRAVENOUS

## 2022-05-01 MED ORDER — LIDOCAINE HCL (PF) 2 % IJ SOLN
INTRAOCULAR | Status: DC | PRN
  Administered 2022-05-01: 1 mL via INTRAOCULAR

## 2022-05-01 MED ORDER — ARMC OPHTHALMIC DILATING DROPS
1.0000 | OPHTHALMIC | Status: DC | PRN
  Administered 2022-05-01 (×3): 1 via OPHTHALMIC

## 2022-05-01 MED ORDER — SIGHTPATH DOSE#1 NA HYALUR & NA CHOND-NA HYALUR IO KIT
PACK | INTRAOCULAR | Status: DC | PRN
  Administered 2022-05-01: 1 via OPHTHALMIC

## 2022-05-01 MED ORDER — SIGHTPATH DOSE#1 BSS IO SOLN
INTRAOCULAR | Status: DC | PRN
  Administered 2022-05-01: 84 mL via OPHTHALMIC

## 2022-05-01 MED ORDER — SIGHTPATH DOSE#1 BSS IO SOLN
INTRAOCULAR | Status: DC | PRN
  Administered 2022-05-01: 15 mL

## 2022-05-01 MED ORDER — MIDAZOLAM HCL 2 MG/2ML IJ SOLN
INTRAMUSCULAR | Status: DC | PRN
  Administered 2022-05-01: 2 mg via INTRAVENOUS

## 2022-05-01 MED ORDER — TETRACAINE HCL 0.5 % OP SOLN
1.0000 [drp] | OPHTHALMIC | Status: DC | PRN
  Administered 2022-05-01 (×3): 1 [drp] via OPHTHALMIC

## 2022-05-01 SURGICAL SUPPLY — 19 items
CANNULA ANT/CHMB 27G (MISCELLANEOUS) IMPLANT
CANNULA ANT/CHMB 27GA (MISCELLANEOUS) IMPLANT
CATARACT SUITE SIGHTPATH (MISCELLANEOUS) ×1 IMPLANT
DISSECTOR HYDRO NUCLEUS 50X22 (MISCELLANEOUS) ×1 IMPLANT
FEE CATARACT SUITE SIGHTPATH (MISCELLANEOUS) ×1 IMPLANT
GLOVE SURG GAMMEX PI TX LF 7.5 (GLOVE) ×1 IMPLANT
GLOVE SURG SYN 8.5  E (GLOVE) ×1
GLOVE SURG SYN 8.5 E (GLOVE) ×1 IMPLANT
GLOVE SURG SYN 8.5 PF PI (GLOVE) ×1 IMPLANT
LENS IOL TECNIS EYHANCE 18.0 (Intraocular Lens) IMPLANT
NDL FILTER BLUNT 18X1 1/2 (NEEDLE) ×1 IMPLANT
NEEDLE FILTER BLUNT 18X1 1/2 (NEEDLE) ×1 IMPLANT
PACK VIT ANT 23G (MISCELLANEOUS) IMPLANT
RING MALYGIN (MISCELLANEOUS) IMPLANT
SUT ETHILON 10-0 CS-B-6CS-B-6 (SUTURE)
SUTURE EHLN 10-0 CS-B-6CS-B-6 (SUTURE) IMPLANT
SYR 3ML LL SCALE MARK (SYRINGE) ×1 IMPLANT
SYR 5ML LL (SYRINGE) ×1 IMPLANT
WATER STERILE IRR 250ML POUR (IV SOLUTION) ×1 IMPLANT

## 2022-05-01 NOTE — H&P (Signed)
[facility]   Primary Care Physician:  Pcp, No Ophthalmologist: Dr. Benay Pillow  Pre-Procedure History & Physical: HPI:  Joan Salazar is a 61 y.o. female here for cataract surgery.   Past Medical History:  Diagnosis Date   Acute pancreatitis 04/10/2011   ?secondary to HCTZ.    Anxiety    per new patient form   Arthritis    Asthma    Complication of anesthesia    pt. states that with previous surgery, she was difficult to get to sleep   GERD (gastroesophageal reflux disease)    H/O hiatal hernia    Headache    history of migraines   High cholesterol    Per new patient form   History of depression 07/10/2006   History of pneumonia 07/11/1995   Hyperlipidemia    stopped statins due to muscle pain   Hypertension    Hypothyroidism    pt. 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 Carol Ada. Per pt, colon polyps and due in five years for f/u.   DG  BONE DENSITY ([place] HX)  2020   Per new patient form   DIAGNOSTIC MAMMOGRAM  2021   Per new patient form   ESOPHAGOGASTRODUODENOSCOPY  1990s   duodenitis per patient   ESOPHAGOGASTRODUODENOSCOPY  05/24/2011   Procedure: ESOPHAGOGASTRODUODENOSCOPY  (EGD);  Surgeon: Daneil Dolin, MD;  Location: [facility];  Service: Endoscopy;  Laterality: N/A;  1:05   REPLACEMENT TOTAL KNEE Left 2021   Per new patient form   TONSILLECTOMY  1984   TOTAL ABDOMINAL HYSTERECTOMY  1991   TRIGGER FINGER RELEASE Right 2015   thumb    Prior to Admission medications   Medication Sig Start Date End Date Taking? Authorizing Provider  busPIRone (BUSPAR) 5 MG tablet Take 1 tablet (5 mg total) by mouth 2 (two) times daily. 10/10/21  Yes Ngetich, Dinah C, NP  ergocalciferol (VITAMIN D2) 1.25 MG (50000 UT) capsule TAKE 1 CAPSULE BY MOUTH TWICE A WEEK. 11/28/21  Yes Ngetich, Dinah C, NP  esomeprazole (NEXIUM) 20 MG capsule Take 20 mg by mouth daily at 12 noon.   Yes [provider]  hydrALAZINE (APRESOLINE) 50 MG tablet Take 1 tablet (50 mg total) by mouth 3 (three) times daily. Patient taking differently: Take 50 mg by mouth in the morning and at bedtime. 03/01/22 08/28/22 Yes Ngetich, Dinah C, NP  hydrochlorothiazide (MICROZIDE) 12.5 MG capsule Take 1 capsule by mouth once daily 02/24/22  Yes Ngetich, Dinah C, NP  Ipratropium-Albuterol (DUONEB IN) Inhale 1 Inhaler into the lungs.   Yes [provider]  Loperamide HCl (IMODIUM PO) Take by mouth as needed.   Yes [provider]  losartan (  COZAAR) 100 MG tablet Take 1 tablet by mouth once daily 12/06/21  Yes Ngetich, Dinah C, NP  Melatonin 12 MG TABS Take by mouth at bedtime as needed.   Yes [provider]  metoprolol succinate (TOPROL-XL) 100 MG 24 hr tablet Take 1 tablet by mouth once daily 12/06/21  Yes Ngetich, Dinah C, NP  Omega-3 Fatty Acids (FISH OIL PO) Take by mouth.   Yes [provider]  sertraline (ZOLOFT) 50 MG tablet Take 1 tablet by mouth once daily 03/20/22  Yes Eubanks, Janene Harvey, NP  albuterol (VENTOLIN HFA) 108 (90 Base) MCG/ACT inhaler Inhale 2 puffs into the lungs every 6 (six) hours as needed for wheezing or shortness of breath.    [provider]   diphenhydrAMINE (SOMINEX) 25 MG tablet Take 25-50 mg by mouth at bedtime as needed for sleep.    [provider]    Allergies as of 03/22/2022 - Review Complete 12/13/2021  Allergen Reaction Noted   Percocet [oxycodone-acetaminophen] Itching 05/20/2015   Codeine Itching 05/18/2011    Family History  Problem Relation Age of Onset   Hypertension Mother    Heart attack Mother        2008   Ulcerative colitis Mother    Arthritis Mother    Arthritis Father    Cancer Father    Hypertension Father    Hyperlipidemia Father    Cancer Sister    Hypertension Sister    High Cholesterol Sister    Hypertension Sister    Diverticulosis Sister    High Cholesterol Brother    Hypertension Brother    Cancer Maternal Grandmother    Stroke Maternal Grandfather        1984   Breast cancer Paternal Grandmother        >50   Hypertension Son    Diverticulosis Son    Dementia Maternal Aunt    Dementia Maternal Uncle    Colon cancer Neg Hx    Liver disease Neg Hx     Social History   Socioeconomic History   Marital status: Divorced    Spouse name: Not on file   Number of children: 1   Years of education: Not on file   Highest education level: Not on file  Occupational History   Occupation: Charity fundraiser DIRECTOR  CLINICAL EDUCATION    Employer: [employer]    Comment: Product manager [employer]  Tobacco Use   Smoking status: Never   Smokeless tobacco: Never  Vaping Use   Vaping Use: Never used  Substance and Sexual Activity   Alcohol use: No   Drug use: No   Sexual activity: Not on file  Other Topics Concern   Not on file  Social History Narrative   Diet: Did not answer      Caffeine: Yes, tea and coffee      Married, if yes what year: Divorced       Do you live in a house, apartment, assisted living, condo, trailer, ect: House      Is it one or more stories: 2      How many persons live in your home? 1      Pets: No      Highest level or education  completed: Associate Degree      Current/Past profession: RN      Exercise:  No             Type and how often:  Living Will: No   DNR: no If not, do you wish to discuss one? Yes   POA/HPOA: No      Functional Status:   Do you have difficulty bathing or dressing yourself? No   Do you have difficulty preparing food or eating? No   Do you have difficulty managing your medications? No   Do you have difficulty managing your finances? No   Do you have difficulty affording your medications? No       Social Determinants of Corporate investment banker Strain: Not on file  Food Insecurity: Not on file  Transportation Needs: Not on file  Physical Activity: Not on file  Stress: Not on file  Social Connections: Not on file  Intimate Partner Violence: Not on file    Review of Systems: See HPI, otherwise negative ROS  Physical Exam: BP (!) 153/103   Pulse (!) 51   Temp 97.7 F (36.5 C) (Temporal)   Resp 20   Ht 5\' 3"  (1.6 m)   Wt 103 kg   SpO2 96%   BMI 40.21 kg/m  General:   Alert, cooperative in NAD Head:  Normocephalic and atraumatic. Respiratory:  Normal work of breathing. Cardiovascular:  RRR  Impression/Plan: Joan Salazar is here for cataract surgery.  Risks, benefits, limitations, and alternatives regarding cataract surgery have been reviewed with the patient.  Questions have been answered.  All parties agreeable.   Angie Fava, MD  05/01/2022, 7:58 AM

## 2022-05-01 NOTE — Transfer of Care (Signed)
Immediate Anesthesia Transfer of Care Note  Patient: Joan Salazar  Procedure(s) Performed: CATARACT EXTRACTION PHACO AND INTRAOCULAR LENS PLACEMENT (IOC) LEFT (Left: Eye)  Patient Location: PACU  Anesthesia Type: MAC  Level of Consciousness: awake, alert  and patient cooperative  Airway and Oxygen Therapy: Patient Spontanous Breathing and Patient connected to supplemental oxygen  Post-op Assessment: Post-op Vital signs reviewed, Patient's Cardiovascular Status Stable, Respiratory Function Stable, Patent Airway and No signs of Nausea or vomiting  Post-op Vital Signs: Reviewed and stable  Complications: No notable events documented.

## 2022-05-01 NOTE — Anesthesia Postprocedure Evaluation (Signed)
Anesthesia Post Note  Patient: Joan Salazar  Procedure(s) Performed: CATARACT EXTRACTION PHACO AND INTRAOCULAR LENS PLACEMENT (IOC) LEFT (Left: Eye)  Patient location during evaluation: PACU Anesthesia Type: MAC Level of consciousness: awake and alert Pain management: pain level controlled Vital Signs Assessment: post-procedure vital signs reviewed and stable Respiratory status: spontaneous breathing, nonlabored ventilation and respiratory function stable Cardiovascular status: blood pressure returned to baseline and stable Postop Assessment: no apparent nausea or vomiting Anesthetic complications: no   No notable events documented.   Last Vitals:  Vitals:   05/01/22 0831 05/01/22 0836  BP: (!) 143/76 (!) 140/81  Pulse: (!) 52 (!) 52  Resp: 14 19  Temp: 36.5 C 36.5 C  SpO2: 96% 96%    Last Pain:  Vitals:   05/01/22 0831  TempSrc:   PainSc: [employer]

## 2022-05-01 NOTE — Op Note (Signed)
OPERATIVE NOTE  Joan Salazar [id number] 05/01/2022   PREOPERATIVE DIAGNOSIS:  Nuclear sclerotic cataract left eye.  H25.12   POSTOPERATIVE DIAGNOSIS:    Nuclear sclerotic cataract left eye.     PROCEDURE:  Phacoemusification with posterior chamber intraocular lens placement of the left eye   LENS:   Implant Name Type Inv. Item Serial No. Manufacturer Lot No. LRB No. Used Action  LENS IOL TECNIS EYHANCE 18.0 - [id number] Intraocular Lens LENS IOL TECNIS EYHANCE 18.0 [id number] [employer]  Left 1 Implanted      Procedure(s) with comments: CATARACT EXTRACTION PHACO AND INTRAOCULAR LENS PLACEMENT (IOC) LEFT (Left) - 3.97 0:35.5  DIB00 +18.0   ULTRASOUND TIME: 0 minutes 35 seconds.  CDE 3.97   SURGEON:  Benay Pillow, MD, MPH   ANESTHESIA:  Topical with tetracaine drops augmented with 1% preservative-free intracameral lidocaine.  ESTIMATED BLOOD LOSS: <1 mL   COMPLICATIONS:  None.   DESCRIPTION OF PROCEDURE:  The patient was identified in the holding room and transported to the operating room and placed in the supine position under the operating microscope.  The left eye was identified as the operative eye and it was prepped and draped in the usual sterile ophthalmic fashion.   A 1.0 millimeter clear-corneal paracentesis was made at the 5:00 position. 0.5 ml of preservative-free 1% lidocaine with epinephrine was injected into the anterior chamber.  The anterior chamber was filled with viscoelastic.  A 2.4 millimeter keratome was used to make a near-clear corneal incision at the 2:00 position.  A curvilinear capsulorrhexis was made with a cystotome and capsulorrhexis forceps.  Balanced salt solution was used to hydrodissect and hydrodelineate the nucleus.   Phacoemulsification was then used in stop and chop fashion to remove the lens nucleus and epinucleus.  The remaining cortex was then removed using the irrigation and aspiration handpiece. Viscoelastic was then placed into the  capsular bag to distend it for lens placement.  A lens was then injected into the capsular bag.  The remaining viscoelastic was aspirated.   Wounds were hydrated with balanced salt solution.  The anterior chamber was inflated to a physiologic pressure with balanced salt solution.  Intracameral vigamox 0.1 mL undiltued was injected into the eye and a drop placed onto the ocular surface.  No wound leaks were noted.  The patient was taken to the recovery room in stable condition without complications of anesthesia or surgery  Benay Pillow 05/01/2022, 8:29 AM

## 2022-05-02 ENCOUNTER — Encounter: Payer: Self-pay | Admitting: Ophthalmology

## 2022-07-24 ENCOUNTER — Encounter: Payer: Self-pay | Admitting: Family

## 2022-07-24 ENCOUNTER — Ambulatory Visit: Payer: 59 | Admitting: Family

## 2022-07-24 VITALS — BP 120/80 | HR 90 | Temp 97.3°F | Resp 16 | Ht 63.0 in | Wt 228.8 lb

## 2022-07-24 DIAGNOSIS — R350 Frequency of micturition: Secondary | ICD-10-CM | POA: Diagnosis not present

## 2022-07-24 DIAGNOSIS — R82998 Other abnormal findings in urine: Secondary | ICD-10-CM | POA: Diagnosis not present

## 2022-07-24 DIAGNOSIS — F418 Other specified anxiety disorders: Secondary | ICD-10-CM | POA: Diagnosis not present

## 2022-07-24 DIAGNOSIS — E782 Mixed hyperlipidemia: Secondary | ICD-10-CM | POA: Diagnosis not present

## 2022-07-24 DIAGNOSIS — R69 Illness, unspecified: Secondary | ICD-10-CM | POA: Diagnosis not present

## 2022-07-24 DIAGNOSIS — R1031 Right lower quadrant pain: Secondary | ICD-10-CM | POA: Diagnosis not present

## 2022-07-24 DIAGNOSIS — I10 Essential (primary) hypertension: Secondary | ICD-10-CM | POA: Diagnosis not present

## 2022-07-24 DIAGNOSIS — E559 Vitamin D deficiency, unspecified: Secondary | ICD-10-CM | POA: Diagnosis not present

## 2022-07-24 DIAGNOSIS — I700 Atherosclerosis of aorta: Secondary | ICD-10-CM | POA: Diagnosis not present

## 2022-07-24 DIAGNOSIS — K582 Mixed irritable bowel syndrome: Secondary | ICD-10-CM | POA: Diagnosis not present

## 2022-07-24 MED ORDER — LOSARTAN POTASSIUM 100 MG PO TABS: 100.0000 mg | ORAL_TABLET | Freq: Every day | ORAL | 1 refills | Status: DC

## 2022-07-24 MED ORDER — HYDRALAZINE HCL 50 MG PO TABS: 50.0000 mg | ORAL_TABLET | Freq: Three times a day (TID) | ORAL | 1 refills | Status: DC

## 2022-07-24 MED ORDER — ASPIRIN 81 MG PO TBEC: 81.0000 mg | DELAYED_RELEASE_TABLET | Freq: Every day | ORAL | 12 refills | Status: AC

## 2022-07-24 MED ORDER — ERGOCALCIFEROL 1.25 MG (50000 UT) PO CAPS: ORAL_CAPSULE | ORAL | 3 refills | Status: DC

## 2022-07-24 MED ORDER — METOPROLOL SUCCINATE ER 100 MG PO TB24: 100.0000 mg | ORAL_TABLET | Freq: Every day | ORAL | 1 refills | Status: DC

## 2022-07-24 MED ORDER — SERTRALINE HCL 50 MG PO TABS: 50.0000 mg | ORAL_TABLET | Freq: Every day | ORAL | 1 refills | Status: DC

## 2022-07-24 MED ORDER — HYDROCHLOROTHIAZIDE 12.5 MG PO CAPS: 12.5000 mg | ORAL_CAPSULE | Freq: Every day | ORAL | 2 refills | Status: DC

## 2022-07-24 MED ORDER — BUSPIRONE HCL 5 MG PO TABS: 5.0000 mg | ORAL_TABLET | Freq: Two times a day (BID) | ORAL | 1 refills | Status: DC

## 2022-07-24 NOTE — Progress Notes (Signed)
Provider: Marlowe Sax FNP-C  Radiah Lubinski, Nelda Bucks, NP  Patient Care Team: Marsh Heckler, Nelda Bucks, NP as PCP - General (Family Medicine) Daneil Dolin, MD (Gastroenterology)  Extended Emergency Contact Information Primary Emergency Contact: Dillard,Katrina Address: [address]          [place], [place] [address] [place] Phone: [phone] Relation: Sister  Code Status:  Full Code  Goals of care: Advanced Directive information    07/24/2022   11:13 AM  Advanced Directives  Does Patient Have a Medical Advance Directive? No  Would patient like information on creating a medical advance directive? No - Patient declined     Chief Complaint  Patient presents with   Acute Visit    Patient complains of blood pressure running high and abdominal pain.    HPI:  Pt is a 62 y.o. female seen today for an acute visit for evaluation of high blood pressure.Has been out of Metoprolol for few days had to double dose for HZCT.Blood pressure improved upon doubling HZCT.she denies any headache,dizziness,vision changes,fatigue,chest tightness,palpitation,chest pain or shortness of breath. States her insurance had changed could not follow up but recently got a new insurance.    Also complains of runny nose,sweats and generalized body aches for several days.states her grandchildren have been sick too.Has taken day/night Quail without any relief. She denies any fever or chills.She did a home COVID-19 test which was negative.   Has chronic diarrhea that has worsen.states has to stay close to a bathroom every time she eats due to diarrhea.Has right lower quadrant abdominal pain whenever bowels move which makes her urinate frequent.right lower abdominal pain usually shots to the back.Previously referred to Gastroenterologist for Colonoscopy but states was waiting for the records She denies denies any fever,chills,nausea,vomiting,flank pain,urgency,frequency,dysuria,difficult  urination or hematuria.  Past Medical History:  Diagnosis Date   Acute pancreatitis 04/10/2011   ?secondary to HCTZ.    Anxiety    per new patient form   Arthritis    Asthma    Complication of anesthesia    pt. states that with previous surgery, she was difficult to get to sleep   GERD (gastroesophageal reflux disease)    H/O hiatal hernia    Headache    history of migraines   High cholesterol    Per new patient form   History of depression 07/10/2006   History of pneumonia 07/11/1995   Hyperlipidemia    stopped statins due to muscle pain   Hypertension    Hypothyroidism    pt. 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 Jeani Hawking. Per pt, colon polyps and due in five years for f/u.   DG  BONE DENSITY ([facility] HX)  2020   Per new patient form   DIAGNOSTIC MAMMOGRAM  2021   Per new patient form   ESOPHAGOGASTRODUODENOSCOPY  1990s   duodenitis per patient    ESOPHAGOGASTRODUODENOSCOPY  05/24/2011   Procedure: ESOPHAGOGASTRODUODENOSCOPY (EGD);  Surgeon: Corbin Ade, MD;  [facility];  Service: Endoscopy;  Laterality: N/A;  1:05   REPLACEMENT TOTAL KNEE Left 2021   Per new patient form   TONSILLECTOMY  1984   TOTAL ABDOMINAL HYSTERECTOMY  1991   TRIGGER FINGER RELEASE Right 2015   thumb    Allergies  Allergen Reactions   Percocet [Oxycodone-Acetaminophen] Itching    Terrible itching   Statins     Joints ache   Codeine Itching    Bad itching    Outpatient Encounter Medications as of 07/24/2022  Medication Sig   albuterol (VENTOLIN HFA) 108 (90 Base) MCG/ACT inhaler Inhale 2 puffs into the lungs every 6 (six) hours as needed for wheezing or shortness of breath.   busPIRone (BUSPAR) 5 MG tablet Take 1 tablet (5 mg total) by mouth 2 (two) times daily.   diphenhydrAMINE (SOMINEX) 25 MG tablet Take 25-50 mg by mouth at bedtime as needed for sleep.   ergocalciferol (VITAMIN D2) 1.25 MG (50000 UT) capsule TAKE 1 CAPSULE BY MOUTH TWICE A WEEK.   esomeprazole (NEXIUM) 20 MG capsule Take 20 mg by mouth daily at 12 noon.   hydrALAZINE (APRESOLINE) 50 MG tablet Take 1 tablet (50 mg total) by mouth 3 (three) times daily.   hydrochlorothiazide (MICROZIDE) 12.5 MG capsule Take 1 capsule by mouth once daily   Ipratropium-Albuterol (DUONEB IN) Inhale 1 Inhaler into the lungs.   Loperamide HCl (IMODIUM PO) Take by mouth as needed.   losartan (COZAAR) 100 MG tablet Take 1 tablet by mouth once daily   Melatonin 12 MG TABS Take by mouth at bedtime as needed.   metoprolol succinate (TOPROL-XL) 100 MG 24 hr tablet Take 1 tablet by mouth once daily   Omega-3 Fatty Acids (FISH OIL PO) Take by mouth.   sertraline (ZOLOFT) 50 MG tablet Take 1 tablet by mouth once daily   No facility-administered encounter medications on file as of 07/24/2022.    Review of Systems  Constitutional:  Negative for appetite change, chills, fatigue, fever and  unexpected weight change.  HENT:  Positive for congestion, postnasal drip, rhinorrhea and sore throat. Negative for dental problem, ear discharge, ear pain, facial swelling, hearing loss, nosebleeds, sinus pressure, sinus pain, sneezing, tinnitus and trouble swallowing.   Eyes:  Negative for pain, discharge, redness, itching and visual disturbance.  Respiratory:  Negative for cough, chest tightness, shortness of breath and wheezing.   Cardiovascular:  Negative for chest pain, palpitations and leg swelling.  Gastrointestinal:  Positive for abdominal pain and diarrhea. Negative for abdominal distention, blood in stool, constipation, nausea and vomiting.       Right lower abdomen  Endocrine: Negative for cold intolerance, heat intolerance, polydipsia, polyphagia and polyuria.  Genitourinary:  Negative for difficulty urinating, dysuria, flank pain, frequency and urgency.  Musculoskeletal:  Negative for arthralgias, back pain, gait problem, joint swelling, myalgias, neck pain and neck stiffness.  Skin:  Negative for color change, pallor, rash and wound.  Neurological:  Negative for dizziness, syncope, speech  difficulty, weakness, light-headedness, numbness and headaches.  Hematological:  Does not bruise/bleed easily.  Psychiatric/Behavioral:  Negative for agitation, behavioral problems, confusion, hallucinations, self-injury, sleep disturbance and suicidal ideas. The patient is not nervous/anxious.     Immunization History  Administered Date(s) Administered   Influenza Split 04/10/2016, 04/18/2018   Influenza-Unspecified 04/24/2017, 04/15/2019, 04/29/2020, 04/28/2021, 05/04/2022   Moderna Sars-Covid-2 Vaccination 09/25/2019, 10/23/2019   Pfizer Covid-19 Vaccine Bivalent Booster 5yrs & up 11/30/2020   Tdap 07/11/2011   Pertinent  Health Maintenance Due  Topic Date Due   PAP SMEAR-Modifier  Never done   COLONOSCOPY (Pts 45-85yrs Insurance coverage will need to be confirmed)  Never done    MAMMOGRAM  11/11/2023   INFLUENZA VACCINE  Completed      09/27/2021    3:48 PM 10/26/2021    2:21 PM 04/17/2022   10:47 AM 05/01/2022    7:00 AM 07/24/2022   11:13 AM  Fall Risk  Falls in the past year? 0 0   0  Was there an injury with Fall? 0 0   0  Fall Risk Category Calculator 0 0   0  Fall Risk Category (Retired) Low Low     (RETIRED) Patient Fall Risk Level Low fall risk Low fall risk Low fall risk Low fall risk   Patient at Risk for Falls Due to No Fall Risks No Fall Risks   No Fall Risks  Fall risk Follow up Falls evaluation completed Falls evaluation completed   Falls evaluation completed   Functional Status Survey:    Vitals:   07/24/22 1111  BP: 120/80  Pulse: 90  Resp: 16  Temp: (!) 97.3 F (36.3 C)  SpO2: 97%  Weight: 228 lb 12.8 oz (103.8 kg)  Height: 5\' 3"  (1.6 m)   Body mass index is 40.53 kg/m. Physical Exam Vitals reviewed.  Constitutional:      General: She is not in acute distress.    Appearance: Normal appearance. She is normal weight. She is not ill-appearing or diaphoretic.  HENT:     Head: Normocephalic.     Right Ear: Tympanic membrane, ear canal and external ear normal. There is no impacted cerumen.     Left Ear: Tympanic membrane, ear canal and external ear normal. There is no impacted cerumen.     Nose: Rhinorrhea present. No congestion.     Comments: Post nasal drip    Mouth/Throat:     Mouth: Mucous membranes are moist.     Pharynx: Oropharynx is clear. No oropharyngeal exudate or posterior oropharyngeal erythema.  Eyes:     General: No scleral icterus.       Right eye: No discharge.        Left eye: No discharge.     Extraocular Movements: Extraocular movements intact.     Conjunctiva/sclera: Conjunctivae normal.     Pupils: Pupils are equal, round, and reactive to light.  Neck:     Vascular: No carotid bruit.  Cardiovascular:     Rate and Rhythm: Normal rate and regular rhythm.     Pulses: Normal pulses.     Heart sounds:  Normal heart sounds. No murmur heard.    No friction rub. No gallop.  Pulmonary:     Effort: Pulmonary effort is normal. No respiratory distress.     Breath sounds: Normal breath sounds. No wheezing, rhonchi or rales.  Chest:     Chest wall: No tenderness.  Abdominal:     General: Bowel sounds are normal. There is  no distension.     Palpations: Abdomen is soft. There is no mass.     Tenderness: There is no abdominal tenderness. There is no right CVA tenderness, left CVA tenderness, guarding or rebound.  Musculoskeletal:        General: No swelling or tenderness. Normal range of motion.     Cervical back: Normal range of motion. No rigidity or tenderness.     Right lower leg: No edema.     Left lower leg: No edema.  Lymphadenopathy:     Cervical: No cervical adenopathy.  Skin:    General: Skin is warm and dry.     Coloration: Skin is not pale.     Findings: No bruising, erythema, lesion or rash.  Neurological:     Mental Status: She is alert and oriented to person, place, and time.     Cranial Nerves: No cranial nerve deficit.     Sensory: No sensory deficit.     Motor: No weakness.     Coordination: Coordination normal.     Gait: Gait normal.  Psychiatric:        Mood and Affect: Mood normal.        Speech: Speech normal.        Behavior: Behavior normal.        Thought Content: Thought content normal.        Judgment: Judgment normal.     Labs reviewed: Recent Labs    09/21/21 0908  NA 143  K 3.4*  CL 101  CO2 30  GLUCOSE 107*  BUN 13  CREATININE 0.94  CALCIUM 9.8   Recent Labs    09/21/21 0908  AST 28  ALT 23  BILITOT 0.9  PROT 7.0   Recent Labs    09/21/21 0908  WBC 8.1  NEUTROABS 5,370  HGB 13.5  HCT 40.2  MCV 93.5  PLT 258   Lab Results  Component Value Date   TSH 3.52 09/21/2021   No results found for: "HGBA1C" Lab Results  Component Value Date   CHOL 286 (H) 09/21/2021   HDL 67 09/21/2021   LDLCALC 195 (H) 09/21/2021   TRIG 117  09/21/2021   CHOLHDL 4.3 09/21/2021    Significant Diagnostic Results in last 30 days:  No results found.  Assessment/Plan  1. Essential hypertension B/p well-controlled had run out of her metoprolol but doubled on her hydrochlorothiazide - hydrALAZINE (APRESOLINE) 50 MG tablet; Take 1 tablet (50 mg total) by mouth 3 (three) times daily.  Dispense: 270 tablet; Refill: 1 - hydrochlorothiazide (MICROZIDE) 12.5 MG capsule; Take 1 capsule (12.5 mg total) by mouth daily.  Dispense: 90 capsule; Refill: 2 - losartan (COZAAR) 100 MG tablet; Take 1 tablet (100 mg total) by mouth daily.  Dispense: 90 tablet; Refill: 1 - metoprolol succinate (TOPROL-XL) 100 MG 24 hr tablet; Take 1 tablet (100 mg total) by mouth daily. Take with or immediately following a meal.  Dispense: 90 tablet; Refill: 1 - aspirin EC 81 MG tablet; Take 1 tablet (81 mg total) by mouth daily. Swallow whole.  Dispense: 30 tablet; Refill: 12 - TSH - COMPLETE METABOLIC PANEL WITH GFR - CBC with Differential/Platelet  2. Depression with anxiety Mood stable -Continue on sertraline and buspirone - busPIRone (BUSPAR) 5 MG tablet; Take 1 tablet (5 mg total) by mouth 2 (two) times daily.  Dispense: 180 tablet; Refill: 1 - sertraline (ZOLOFT) 50 MG tablet; Take 1 tablet (50 mg total) by mouth daily.  Dispense: 90 tablet; Refill:  1 - TSH  3. Mixed hyperlipidemia Previous LDL 195 statin intolerance -Continue on omega-3 fatty acids -Dietary modification and exercise advised - Lipid panel  4. Vitamin D deficiency Continue on vitamin D supplements - ergocalciferol (VITAMIN D2) 1.25 MG (50000 UT) capsule; TAKE 1 CAPSULE BY MOUTH TWICE A WEEK.  Dispense: 4 capsule; Refill: 3  5. Atherosclerosis of abdominal aorta (HCC) Allergic to statins -Will continue to manage high risk factors  6. Right lower quadrant abdominal pain Worsening symptoms with diarrhea.  Previously referred to GI for colonoscopy but her insurance had changed so did  not follow-up. - Ambulatory referral to Gastroenterology  7. Irritable bowel syndrome with both constipation and diarrhea Worsening diarrhea with right lower quadrant abdominal pain.  Referred to GI as below - Ambulatory referral to Gastroenterology  8. Urine frequency Will obtain urine specimen to rule out urinary tract infection -Encouraged to increase water intake to 6 to 8 glasses daily - Urine Culture   Family/ staff Communication: Reviewed plan of care with patient verbalized understanding  Labs/tests ordered:  - Urine Culture - TSH - COMPLETE METABOLIC PANEL WITH GFR - CBC with Differential/Platelet - Lipid panel  Next Appointment: Return in about 6 months (around 01/22/2023) for medical mangement of chronic issues.Caesar Bookman, NP

## 2022-07-25 LAB — CBC WITH DIFFERENTIAL/PLATELET
Absolute Monocytes: 699 cells/uL (ref 200–950)
Basophils Absolute: 64 cells/uL (ref 0–200)
Basophils Relative: 0.7 %
Eosinophils Absolute: 230 cells/uL (ref 15–500)
Eosinophils Relative: 2.5 %
HCT: 39 % (ref 35.0–45.0)
Hemoglobin: 13.1 g/dL (ref 11.7–15.5)
Lymphs Abs: 1730 cells/uL (ref 850–3900)
MCH: 31 pg (ref 27.0–33.0)
MCHC: 33.6 g/dL (ref 32.0–36.0)
MCV: 92.4 fL (ref 80.0–100.0)
MPV: 11.5 fL (ref 7.5–12.5)
Monocytes Relative: 7.6 %
Neutro Abs: 6477 cells/uL (ref 1500–7800)
Neutrophils Relative %: 70.4 %
Platelets: 291 10*3/uL (ref 140–400)
RBC: 4.22 10*6/uL (ref 3.80–5.10)
RDW: 12.4 % (ref 11.0–15.0)
Total Lymphocyte: 18.8 %
WBC: 9.2 10*3/uL (ref 3.8–10.8)

## 2022-07-25 LAB — LIPID PANEL
Cholesterol: 310 mg/dL — ABNORMAL HIGH (ref <200)
HDL: 62 mg/dL (ref >=50)
LDL Cholesterol (Calc): 220 mg/dL (calc) — ABNORMAL HIGH
Non-HDL Cholesterol (Calc): 248 mg/dL (calc) — ABNORMAL HIGH (ref <130)
Total CHOL/HDL Ratio: 5 (calc) — ABNORMAL HIGH (ref <5.0)
Triglycerides: 130 mg/dL (ref <150)

## 2022-07-25 LAB — COMPLETE METABOLIC PANEL WITH GFR
AG Ratio: 1.4 (calc) (ref 1.0–2.5)
ALT: 8 U/L (ref 6–29)
AST: 18 U/L (ref 10–35)
Albumin: 4.4 g/dL (ref 3.6–5.1)
Alkaline phosphatase (APISO): 72 U/L (ref 37–153)
BUN: 17 mg/dL (ref 7–25)
CO2: 25 mmol/L (ref 20–32)
Calcium: 10.1 mg/dL (ref 8.6–10.4)
Chloride: 102 mmol/L (ref 98–110)
Creat: 0.91 mg/dL (ref 0.50–1.05)
Globulin: 3.1 g/dL (calc) (ref 1.9–3.7)
Glucose, Bld: 90 mg/dL (ref 65–99)
Potassium: 3.5 mmol/L (ref 3.5–5.3)
Sodium: 142 mmol/L (ref 135–146)
Total Bilirubin: 0.8 mg/dL (ref 0.2–1.2)
Total Protein: 7.5 g/dL (ref 6.1–8.1)
eGFR: 71 mL/min/{1.73_m2} (ref >=60)

## 2022-07-25 LAB — TSH: TSH: 1.46 mIU/L (ref 0.40–4.50)

## 2022-07-25 LAB — URINE CULTURE
MICRO NUMBER:: 14431639
Result:: NO GROWTH
SPECIMEN QUALITY:: ADEQUATE

## 2022-07-29 DIAGNOSIS — R350 Frequency of micturition: Secondary | ICD-10-CM | POA: Diagnosis not present

## 2022-07-29 DIAGNOSIS — N390 Urinary tract infection, site not specified: Secondary | ICD-10-CM | POA: Diagnosis not present

## 2022-07-30 ENCOUNTER — Emergency Department: Payer: 59

## 2022-07-30 ENCOUNTER — Emergency Department
Admission: EM | Admit: 2022-07-30 | Discharge: 2022-07-30 | Disposition: A | Discharge status: Home / Self Care | Payer: 59 | Attending: Emergency Medicine | Admitting: Emergency Medicine

## 2022-07-30 ENCOUNTER — Other Ambulatory Visit: Payer: Self-pay

## 2022-07-30 DIAGNOSIS — E039 Hypothyroidism, unspecified: Secondary | ICD-10-CM | POA: Diagnosis not present

## 2022-07-30 DIAGNOSIS — J45909 Unspecified asthma, uncomplicated: Secondary | ICD-10-CM | POA: Diagnosis not present

## 2022-07-30 DIAGNOSIS — N390 Urinary tract infection, site not specified: Secondary | ICD-10-CM

## 2022-07-30 DIAGNOSIS — R319 Hematuria, unspecified: Secondary | ICD-10-CM | POA: Diagnosis not present

## 2022-07-30 DIAGNOSIS — Z79899 Other long term (current) drug therapy: Secondary | ICD-10-CM | POA: Insufficient documentation

## 2022-07-30 DIAGNOSIS — I10 Essential (primary) hypertension: Secondary | ICD-10-CM | POA: Insufficient documentation

## 2022-07-30 DIAGNOSIS — Z7951 Long term (current) use of inhaled steroids: Secondary | ICD-10-CM | POA: Diagnosis not present

## 2022-07-30 DIAGNOSIS — M549 Dorsalgia, unspecified: Secondary | ICD-10-CM | POA: Diagnosis not present

## 2022-07-30 DIAGNOSIS — I700 Atherosclerosis of aorta: Secondary | ICD-10-CM | POA: Diagnosis not present

## 2022-07-30 DIAGNOSIS — Z7982 Long term (current) use of aspirin: Secondary | ICD-10-CM | POA: Insufficient documentation

## 2022-07-30 DIAGNOSIS — R109 Unspecified abdominal pain: Secondary | ICD-10-CM | POA: Diagnosis not present

## 2022-07-30 DIAGNOSIS — R1030 Lower abdominal pain, unspecified: Secondary | ICD-10-CM | POA: Diagnosis not present

## 2022-07-30 DIAGNOSIS — N200 Calculus of kidney: Secondary | ICD-10-CM | POA: Diagnosis not present

## 2022-07-30 DIAGNOSIS — R0902 Hypoxemia: Secondary | ICD-10-CM | POA: Diagnosis not present

## 2022-07-30 DIAGNOSIS — B9689 Other specified bacterial agents as the cause of diseases classified elsewhere: Secondary | ICD-10-CM | POA: Insufficient documentation

## 2022-07-30 DIAGNOSIS — Z96652 Presence of left artificial knee joint: Secondary | ICD-10-CM | POA: Diagnosis not present

## 2022-07-30 DIAGNOSIS — M545 Low back pain, unspecified: Secondary | ICD-10-CM | POA: Diagnosis not present

## 2022-07-30 LAB — COMPREHENSIVE METABOLIC PANEL
ALT: 14 U/L (ref 0–44)
AST: 36 U/L (ref 15–41)
Albumin: 4.2 g/dL (ref 3.5–5.0)
Alkaline Phosphatase: 64 U/L (ref 38–126)
Anion gap: 12 (ref 5–15)
BUN: 16 mg/dL (ref 8–23)
CO2: 24 mmol/L (ref 22–32)
Calcium: 9.4 mg/dL (ref 8.9–10.3)
Chloride: 104 mmol/L (ref 98–111)
Creatinine, Ser: 0.96 mg/dL (ref 0.44–1.00)
GFR, Estimated: >60 mL/min (ref >60)
Glucose, Bld: 126 mg/dL — ABNORMAL HIGH (ref 70–99)
Potassium: 3 mmol/L — ABNORMAL LOW (ref 3.5–5.1)
Sodium: 140 mmol/L (ref 135–145)
Total Bilirubin: 1 mg/dL (ref 0.3–1.2)
Total Protein: 7.5 g/dL (ref 6.5–8.1)

## 2022-07-30 LAB — CBC WITH DIFFERENTIAL/PLATELET
Abs Immature Granulocytes: 0.37 10*3/uL — ABNORMAL HIGH (ref 0.00–0.07)
Basophils Absolute: 0.1 10*3/uL (ref 0.0–0.1)
Basophils Relative: 0 %
Eosinophils Absolute: 0 10*3/uL (ref 0.0–0.5)
Eosinophils Relative: 0 %
HCT: 38 % (ref 36.0–46.0)
Hemoglobin: 12.7 g/dL (ref 12.0–15.0)
Immature Granulocytes: 2 %
Lymphocytes Relative: 8 %
Lymphs Abs: 1.4 10*3/uL (ref 0.7–4.0)
MCH: 30.6 pg (ref 26.0–34.0)
MCHC: 33.4 g/dL (ref 30.0–36.0)
MCV: 91.6 fL (ref 80.0–100.0)
Monocytes Absolute: 0.6 10*3/uL (ref 0.1–1.0)
Monocytes Relative: 4 %
Neutro Abs: 14 10*3/uL — ABNORMAL HIGH (ref 1.7–7.7)
Neutrophils Relative %: 86 %
Platelets: 335 10*3/uL (ref 150–400)
RBC: 4.15 MIL/uL (ref 3.87–5.11)
RDW: 12.5 % (ref 11.5–15.5)
WBC: 16.4 10*3/uL — ABNORMAL HIGH (ref 4.0–10.5)
nRBC: 0 % (ref 0.0–0.2)

## 2022-07-30 MED ORDER — CIPROFLOXACIN HCL 500 MG PO TABS: 500.0000 mg | ORAL_TABLET | Freq: Two times a day (BID) | ORAL | 0 refills | Status: AC

## 2022-07-30 MED ORDER — POTASSIUM CHLORIDE CRYS ER 20 MEQ PO TBCR
20.0000 meq | EXTENDED_RELEASE_TABLET | Freq: Once | ORAL | Status: AC
  Administered 2022-07-30: 20 meq via ORAL
  Filled 2022-07-30: qty 1

## 2022-07-30 MED ORDER — IOHEXOL 300 MG/ML  SOLN
100.0000 mL | Freq: Once | INTRAMUSCULAR | Status: AC | PRN
  Administered 2022-07-30: 100 mL via INTRAVENOUS

## 2022-07-30 MED ORDER — SODIUM CHLORIDE 0.9 % IV SOLN
1.0000 g | Freq: Once | INTRAVENOUS | Status: AC
  Administered 2022-07-30: 1 g via INTRAVENOUS
  Filled 2022-07-30: qty 10

## 2022-07-30 NOTE — ED Provider Notes (Signed)
[facility] Emergency Department Provider Note   ____________________________________________   Event Date/Time   First MD Initiated Contact with Patient 07/30/22 1737     (approximate)  I have reviewed the triage vital signs and the nursing notes.   HISTORY  Chief Complaint Urinary Frequency    HPI Joan Salazar is a 62 y.o. female presents to the emergency room via EMS.  She reports that she was seen at her primary care doctor's office Monday for similar complaints of UTI symptoms and UA-urine culture were normal therefore she was not treated.  She then returned to urgent care yesterday because her symptoms persisted.  Her UA showed leukocytes and blood.  Therefore, they treated her with Macrobid and Pyridium.  She reports to the ER today stating that she is continuing to have lower abdominal pain.  She reports she is also having right-sided lower back pain that radiates into her right lower pelvic region.  She reports the pain as a sharp stabbing pain.  She reports that she is also having bladder spasms with cramping.  She relates the pain to labor pains.  She also states that she feels like her "insides are falling out".  She reports that last night she did take baclofen for the pain with no relief.  She also took ibuprofen today for the pain with no relief.  She reports that on the way to the emergency room, EMS gave her fentanyl.  She reports with this she was able to void a large amount and this is relieve some of the pressure/pain that she was having.  She reports that now she is still having 6 out of 10 pain.  She reports that prior to voiding with EMS, she had not voided in the last 12 hours.  She reports that she had only been dribbling.   Past Medical History:  Diagnosis Date   Acute pancreatitis 04/10/2011   ?secondary to HCTZ.    Anxiety    per new patient form   Arthritis    Asthma    Complication of anesthesia    pt. states that with previous  surgery, she was difficult to get to sleep   GERD (gastroesophageal reflux disease)    H/O hiatal hernia    Headache    history of migraines   High cholesterol    Per new patient form   History of depression 07/10/2006   History of pneumonia 07/11/1995   Hyperlipidemia    stopped statins due to muscle pain   Hypertension    Hypothyroidism    pt. 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/15/2014   Degenerative disc disease, cervical 10/15/2014   Right foot pain 12/04/2013   Abdominal pain 05/16/2011   Acute pancreatitis 05/04/2011    Class: Hospitalized for   Abnormal LFTs 05/04/2011   Diarrhea 05/04/2011   HYPERLIPIDEMIA-MIXED 06/16/2009   HYPERTENSION, MALIGNANT, UNCONTROLLED 06/16/2009   FATIGUE / MALAISE 06/16/2009   PALPITATIONS 06/16/2009    Past Surgical History:  Procedure Laterality Date   ANTERIOR CERVICAL DECOMP/DISCECTOMY FUSION N/A 05/27/2015   Procedure: Anterior Cervical Diskectomy and Fusion-Cervical four-five, Cervical five-six ;  Surgeon: Eustace Moore,  MD;  Location: [place] [facility];  Service: Neurosurgery;  Laterality: N/A;   APPENDECTOMY  1979   CARDIAC CATHETERIZATION  2005   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 Carol Ada. Per pt, colon polyps and due in five years for f/u.   DG  BONE DENSITY ([place] HX)  2020   Per new patient form   DIAGNOSTIC MAMMOGRAM  2021   Per new patient form   ESOPHAGOGASTRODUODENOSCOPY  1990s   duodenitis per patient   ESOPHAGOGASTRODUODENOSCOPY  05/24/2011   Procedure: ESOPHAGOGASTRODUODENOSCOPY (EGD);  Surgeon: Daneil Dolin, MD;  [facility];  Service: Endoscopy;  Laterality: N/A;  1:05   REPLACEMENT TOTAL KNEE Left 2021   Per new patient form   TONSILLECTOMY  1984   TOTAL ABDOMINAL HYSTERECTOMY  1991   TRIGGER FINGER RELEASE Right 2015   thumb    Prior to Admission medications   Medication Sig Start Date End Date Taking? Authorizing Provider  ciprofloxacin (CIPRO) 500 MG tablet Take 1 tablet (500 mg total) by mouth 2 (two) times daily for 7 days. 07/30/22 08/06/22 Yes Willaim Rayas, NP  albuterol (VENTOLIN HFA) 108 (90 Base) MCG/ACT inhaler Inhale 2 puffs into the lungs every 6 (six) hours as needed for wheezing or shortness of breath.    [provider]  aspirin EC 81 MG tablet Take 1 tablet (81 mg total) by mouth daily. Swallow whole. 07/24/22   Ngetich, Dinah C, NP  busPIRone (BUSPAR) 5 MG tablet Take 1 tablet (5 mg total) by mouth 2 (two) times daily. 07/24/22   Ngetich, Dinah C, NP  diphenhydrAMINE (SOMINEX) 25 MG tablet Take 25-50 mg by mouth at bedtime as needed for sleep.    [provider]  ergocalciferol (VITAMIN D2) 1.25 MG (50000 UT) capsule TAKE 1 CAPSULE BY MOUTH TWICE A WEEK. 07/24/22   Ngetich, Dinah C, NP  esomeprazole (NEXIUM) 20 MG capsule Take 20 mg by mouth daily at 12 noon.    [provider]  hydrALAZINE (APRESOLINE) 50 MG tablet Take 1 tablet (50 mg total) by mouth 3 (three) times daily. 07/24/22 01/20/23  Ngetich, Dinah C, NP  hydrochlorothiazide (MICROZIDE) 12.5 MG capsule Take 1 capsule (12.5 mg total) by mouth daily.  07/24/22   Ngetich, Dinah C, NP  Ipratropium-Albuterol (DUONEB IN) Inhale 1 Inhaler into the lungs.    [provider]  Loperamide HCl (IMODIUM PO) Take by mouth as needed.    [provider]  losartan (COZAAR) 100 MG tablet Take 1 tablet (100 mg total) by mouth daily. 07/24/22   Ngetich, Dinah C, NP  Melatonin 12 MG TABS Take by mouth at bedtime as needed.    [provider]  metoprolol succinate (TOPROL-XL) 100 MG 24 hr tablet Take 1 tablet (100 mg total) by mouth daily. Take with or immediately following a meal. 07/24/22   Ngetich, Dinah C, NP  Omega-3 Fatty Acids (FISH OIL PO) Take by mouth.    [provider]  sertraline (  ZOLOFT) 50 MG tablet Take 1 tablet (50 mg total) by mouth daily. 07/24/22   Ngetich, Donalee Citrin, NP    Allergies Percocet [oxycodone-acetaminophen], Statins, and Codeine  Family History  Problem Relation Age of Onset   Hypertension Mother    Heart attack Mother        2008   Ulcerative colitis Mother    Arthritis Mother    Arthritis Father    Cancer Father    Hypertension Father    Hyperlipidemia Father    Cancer Sister    Hypertension Sister    High Cholesterol Sister    Hypertension Sister    Diverticulosis Sister    High Cholesterol Brother    Hypertension Brother    Cancer Maternal Grandmother    Stroke Maternal Grandfather        1984   Breast cancer Paternal Grandmother        >50   Hypertension Son    Diverticulosis Son    Dementia Maternal Aunt    Dementia Maternal Uncle    Colon cancer Neg Hx    Liver disease Neg Hx     Social History Social History   Tobacco Use   Smoking status: Never   Smokeless tobacco: Never  Vaping Use   Vaping Use: Never used  Substance Use Topics   Alcohol use: No   Drug use: No    Review of Systems  Constitutional: No fever/chills Eyes: No visual changes. ENT: No sore throat. Cardiovascular: Denies chest pain. Respiratory: Denies shortness of breath. Gastrointestinal:   No nausea, no vomiting.  No diarrhea.  No constipation.  Positive for lower abdominal pain/cramping. Genitourinary: Negative for dysuria. Musculoskeletal: Sitting for right lower back pain. Skin: Negative for rash. Neurological: Negative for headaches, focal weakness or numbness.   ____________________________________________   PHYSICAL EXAM:  VITAL SIGNS: ED Triage Vitals  Enc Vitals Group     BP 07/30/22 1729 (!) 153/90     Pulse Rate 07/30/22 1729 77     Resp 07/30/22 1729 18     Temp 07/30/22 1728 98.6 F (37 C)     Temp Source 07/30/22 1728 Oral     SpO2 07/30/22 1729 94 %     Weight 07/30/22 1728 225 lb (102.1 kg)     Height --      Head Circumference --      Peak Flow --      Pain Score 07/30/22 1728 5     Pain Loc --      Pain Edu? --      Excl. in GC? --     Constitutional: Alert and oriented. Well appearing and in no acute distress. Eyes: Conjunctivae are normal. PERRL. EOMI. Head: Atraumatic. Nose: No congestion/rhinnorhea. Mouth/Throat: Mucous membranes are moist.  Oropharynx non-erythematous. Neck: No stridor.   Cardiovascular: Normal rate, regular rhythm. Grossly normal heart sounds.  Good peripheral circulation. Respiratory: Normal respiratory effort.  No retractions. Lungs CTAB. Gastrointestinal: Soft and nontender. No distention. No abdominal bruits. No CVA tenderness. Genitourinary: No tenderness noted to lower abdomen.  No abnormalities noted to vaginal area:  Musculoskeletal: No lower extremity tenderness nor edema.  No joint effusions. Neurologic:  Normal speech and language. No gross focal neurologic deficits are appreciated. No gait instability. Skin:  Skin is warm, dry and intact. No rash noted. Psychiatric: Mood and affect are normal. Speech and behavior are normal.  ____________________________________________   LABS (all labs ordered are listed, but only abnormal results are displayed)  Labs  Reviewed  CBC WITH DIFFERENTIAL/PLATELET -  Abnormal; Notable for the following components:      Result Value   WBC 16.4 (*)    Neutro Abs 14.0 (*)    Abs Immature Granulocytes 0.37 (*)    All other components within normal limits  COMPREHENSIVE METABOLIC PANEL - Abnormal; Notable for the following components:   Potassium 3.0 (*)    Glucose, Bld 126 (*)    All other components within normal limits   ____________________________________________  EKG   ____________________________________________  RADIOLOGY  ED MD interpretation: CT of abdomen and pelvis were reviewed by me and read by radiologist.  Official radiology report(s): CT ABDOMEN PELVIS W CONTRAST  Result Date: 07/30/2022 CLINICAL DATA:  Abdominal pain, acute (Ped 0-17y). Low back pain, groin pain, hematuria. EXAM: CT ABDOMEN AND PELVIS WITH CONTRAST TECHNIQUE: Multidetector CT imaging of the abdomen and pelvis was performed using the standard protocol following bolus administration of intravenous contrast. RADIATION DOSE REDUCTION: This exam was performed according to the departmental dose-optimization program which includes automated exposure control, adjustment of the mA and/or kV according to patient size and/or use of iterative reconstruction technique. CONTRAST:  122mL OMNIPAQUE IOHEXOL 300 MG/ML  SOLN COMPARISON:  09/20/2016 FINDINGS: Lower chest: No acute abnormality. Hepatobiliary: No focal liver abnormality is seen. No gallstones, gallbladder wall thickening, or biliary dilatation. Pancreas: Unremarkable. No pancreatic ductal dilatation or surrounding inflammatory changes. Spleen: Normal in size without focal abnormality. Adrenals/Urinary Tract: The adrenal glands are unremarkable. Junctional defect within the upper pole of the left kidney. Vascular calcification within the a superior right renal hilum. The kidneys are otherwise unremarkable. No hydronephrosis identified. No urinary renal or ureteral calculi. The bladder is unremarkable. A 9 mm rounded calculus is  seen within the left parasagittal periurethral soft tissues at the level the pubic symphysis, best seen on axial image # 88/2 and coronal image # 56/5 with a small amount of surrounding fluid which may represent a calculus within a urethral diverticulum. Stomach/Bowel: Stomach is within normal limits. Appendix absent. No evidence of bowel wall thickening, distention, or inflammatory changes. Vascular/Lymphatic: Aortic atherosclerosis. No enlarged abdominal or pelvic lymph nodes. Reproductive: Status post hysterectomy. No adnexal masses. Other: No abdominal wall hernia or abnormality. No abdominopelvic ascites. Musculoskeletal: Degenerative changes are seen within the lumbar spine. No acute bone abnormality. No lytic or blastic bone lesion. IMPRESSION: 1. No acute intra-abdominal pathology identified. No definite radiographic explanation for the patient's reported symptoms. 2. 9 mm rounded calculus within the left parasagittal periurethral soft tissues at the level of the pubic symphysis with a small amount of surrounding fluid which may represent a calculus within a urethral diverticulum. This could be confirmed with dedicated MRI examination. Aortic Atherosclerosis (ICD10-I70.0). Electronically Signed   By: Fidela Salisbury M.D.   On: 07/30/2022 19:41    ____________________________________________   PROCEDURES  Procedure(s) performed: None  Procedures  Critical Care performed: No  ____________________________________________   INITIAL IMPRESSION / ASSESSMENT AND PLAN / ED COURSE     Joan Salazar is a 62 y.o. female presents to the emergency room via EMS.  She reports that she was seen at her primary care doctor's office Monday for similar complaints of UTI symptoms and UA-urine culture were normal therefore she was not treated.  She then returned to urgent care yesterday because her symptoms persisted.  Her UA showed leukocytes and blood.  Therefore, they treated her with Macrobid and Pyridium.   She reports to the ER today stating that she is continuing  to have lower abdominal pain.  She reports she is also having right-sided lower back pain that radiates into her right lower pelvic region.  She reports the pain as a sharp stabbing pain.  She reports that she is also having bladder spasms with cramping.  She relates the pain to labor pains.  She also states that she feels like her "insides are falling out".  She reports that last night she did take baclofen for the pain with no relief.  She also took ibuprofen today for the pain with no relief.  She reports that on the way to the emergency room, EMS gave her fentanyl.  She reports with this she was able to void a large amount and this is relieve some of the pressure/pain that she was having.  She reports that now she is still having 6 out of 10 pain.  She reports that prior to voiding with EMS, she had not voided in the last 12 hours.  She reports that she had only been dribbling.   CT of abdomen and pelvis ordered today. Also ordered bladder scan. Urinalysis will not be ordered as patient is taking Pyridium Will order CBC and CMP.  CBC results with white count of 16.4.. CMP resulted with potassium of 3.0 Bladder scan reveals 195 cc. Will give IV ceftriaxone 1 g Will give potassium 20 mEq  CT of abdomen and pelvis reveal 9 mm rounded calculus within the left parasagittal periurethral  soft tissues at the level of the pubic symphysis with a small amount  of surrounding fluid which may represent a calculus within a  urethral diverticulum. This could be confirmed with dedicated MRI  examination.  Patient will need urology follow-up.  She will be given urologist information on discharge.  Patient will be discharged on Cipro with follow-up with urology. She should also follow-up with her primary care provider after finishing antibiotics to make sure that UTI has cleared.  Be discharged home in stable condition with her son after finishing  Rocephin IV infusion.     ____________________________________________   FINAL CLINICAL IMPRESSION(S) / ED DIAGNOSES  Final diagnoses:  Urinary tract infection without hematuria, site unspecified  Renal calculus     ED Discharge Orders          Ordered    ciprofloxacin (CIPRO) 500 MG tablet  2 times daily        07/30/22 1959             Note:  This document was prepared using Dragon voice recognition software and may include unintentional dictation errors.     Willaim Rayas, NP 07/30/22 Jarold Motto, MD 07/31/22 [phone]

## 2022-07-30 NOTE — ED Notes (Signed)
Pt sts that EMS gave her fentanyl and it made her release her bladder.

## 2022-07-30 NOTE — Discharge Instructions (Signed)
Today in the emergency room and diagnosed with a UTI.  You have also been noted on your CT scan to have a renal calculus that could be of significance.  You should follow-up with urology.  I will provide you with the name and number of the on-call provider today.  You should call them in the morning to schedule an appointment.  I will also be changing your antibiotic from Macrobid to Cipro.  Please stop taking the Macrobid and start taking the Cipro 1 twice a day for the next 7 days.  After you finish the Cipro you should follow-up with your primary care provider to make sure that your UTI has cleared.

## 2022-07-30 NOTE — ED Notes (Signed)
Pt to ER via EMS from home with c/o possible kidney stones.  Lower back pain that radiates into groin.  Seen at [facility] yesterday and had blood in urine.  Pain has increased and difficulty urinating.

## 2022-07-30 NOTE — ED Triage Notes (Signed)
Pt sts that she has been having urinary retention from a UTI. Pt sts that she went to a UC to get a abx for it which she did but it is not helping.

## 2022-07-31 ENCOUNTER — Ambulatory Visit: Payer: 59 | Admitting: Urology

## 2022-08-01 ENCOUNTER — Other Ambulatory Visit: Payer: Self-pay | Admitting: Family

## 2022-08-01 DIAGNOSIS — E782 Mixed hyperlipidemia: Secondary | ICD-10-CM

## 2022-08-01 NOTE — Progress Notes (Signed)
Referral placed to cardiology for hyperlipidemia.

## 2022-09-05 ENCOUNTER — Other Ambulatory Visit: Payer: Self-pay

## 2022-09-05 ENCOUNTER — Ambulatory Visit (HOSPITAL_COMMUNITY)
Admission: RE | Admit: 2022-09-05 | Discharge: 2022-09-05 | Disposition: A | Discharge status: Home / Self Care | Payer: 59 | Source: Ambulatory Visit | Attending: Urology | Admitting: Urology

## 2022-09-05 ENCOUNTER — Ambulatory Visit: Payer: 59 | Admitting: Urology

## 2022-09-05 ENCOUNTER — Encounter: Payer: Self-pay | Admitting: Urology

## 2022-09-05 VITALS — BP 171/74 | HR 74

## 2022-09-05 DIAGNOSIS — Z111 Encounter for screening for respiratory tuberculosis: Secondary | ICD-10-CM | POA: Diagnosis not present

## 2022-09-05 DIAGNOSIS — N200 Calculus of kidney: Secondary | ICD-10-CM

## 2022-09-05 DIAGNOSIS — R109 Unspecified abdominal pain: Secondary | ICD-10-CM | POA: Diagnosis not present

## 2022-09-05 DIAGNOSIS — K5900 Constipation, unspecified: Secondary | ICD-10-CM | POA: Diagnosis not present

## 2022-09-05 NOTE — Progress Notes (Signed)
09/05/2022 2:34 PM   Shauna Hugh Frederico Hamman 1960-12-22 [id number]  Referring provider: Sandrea Hughs, NP [address] [place],  [place] [address]  nephrolithiasis   HPI: [name] is a 62yo here for followup for nephrolithiasis. She passed a 45m calculus 1 month ago. Starting January 15th she developed right flank pain and abdominal pain and presented to the ER and was diagnosed with a stone in her urethra which she passed 2 days later. She denies any flank pain currently. This was her first stone event. She denies any significant LUTS. She has mild urgency.     PMH: Past Medical History:  Diagnosis Date   Acute pancreatitis 04/10/2011   ?secondary to HCTZ.    Anxiety    per new patient form   Arthritis    Asthma    Complication of anesthesia    pt. states that with previous surgery, she was difficult to get to sleep   GERD (gastroesophageal reflux disease)    H/O hiatal hernia    Headache    history of migraines   High cholesterol    Per new patient form   History of depression 07/10/2006   History of pneumonia 07/11/1995   Hyperlipidemia    stopped statins due to muscle pain   Hypertension    Hypothyroidism    pt. 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 PCarol Ada Per pt, colon polyps and due in five years for f/u.   DG  B[place] DENSITY ([place])  2020   Per new patient form   DIAGNOSTIC MAMMOGRAM  2021   Per new patient form   ESOPHAGOGASTRODUODENOSCOPY  1990s   duodenitis per patient   ESOPHAGOGASTRODUODENOSCOPY  05/24/2011   Procedure: ESOPHAGOGASTRODUODENOSCOPY (EGD);  Surgeon: RDaneil Dolin MD;  [facility];  Service: Endoscopy;  Laterality: N/A;  1:05   REPLACEMENT TOTAL KNEE Left 2021   Per new patient form   TONSILLECTOMY  1984   TOTAL ABDOMINAL HYSTERECTOMY  1991   TRIGGER FINGER RELEASE Right 2015   thumb    Home Medications:  Allergies as of 09/05/2022       Reactions   Percocet [oxycodone-acetaminophen] Itching   Terrible itching   Statins    Joints ache   Codeine Itching   Bad itching        Medication List        Accurate as of September 05, 2022  2:34 PM. If you have  any questions, ask your nurse or doctor.          albuterol 108 (90 Base) MCG/ACT inhaler Commonly known as: VENTOLIN HFA Inhale 2 puffs into the lungs every 6 (six) hours as needed for wheezing or shortness of breath.   aspirin EC 81 MG tablet Take 1 tablet (81 mg total) by mouth daily. Swallow whole.   busPIRone 5 MG tablet Commonly known as: BUSPAR Take 1 tablet (5 mg total) by mouth 2 (two) times daily.   diphenhydrAMINE 25 MG tablet Commonly known as: SOMINEX Take 25-50 mg by mouth at bedtime as needed for sleep.   DUONEB IN Inhale 1 Inhaler into the lungs.   ergocalciferol 1.25 MG (50000 UT) capsule Commonly known as: VITAMIN D2 TAKE 1 CAPSULE BY MOUTH TWICE A WEEK.   esomeprazole 20  MG capsule Commonly known as: NEXIUM Take 20 mg by mouth daily at 12 noon.   FISH OIL PO Take by mouth.   hydrALAZINE 50 MG tablet Commonly known as: APRESOLINE Take 1 tablet (50 mg total) by mouth 3 (three) times daily.   hydrochlorothiazide 12.5 MG capsule Commonly known as: MICROZIDE Take 1 capsule (12.5 mg total) by mouth daily.   IMODIUM PO Take by mouth as needed.   losartan 100 MG tablet Commonly known as: COZAAR Take 1 tablet (100 mg total) by mouth daily.   Melatonin 12 MG Tabs Take by mouth at bedtime as needed.   metoprolol succinate 100 MG 24 hr tablet Commonly known as: TOPROL-XL Take 1 tablet (100 mg total) by mouth daily. Take with or immediately following a meal.   sertraline 50 MG tablet Commonly known as: ZOLOFT Take 1 tablet (50 mg total) by mouth daily.        Allergies:  Allergies  Allergen Reactions   Percocet [Oxycodone-Acetaminophen] Itching    Terrible itching   Statins     Joints ache   Codeine Itching    Bad itching    Family History: Family History  Problem Relation Age of Onset   Hypertension Mother    Heart attack Mother        2008   Ulcerative colitis Mother    Arthritis Mother    Arthritis Father    Cancer Father    Hypertension Father    Hyperlipidemia Father    Cancer Sister    Hypertension Sister    High Cholesterol Sister    Hypertension Sister    Diverticulosis Sister    High Cholesterol Brother    Hypertension Brother    Cancer Maternal Grandmother    Stroke Maternal Grandfather        1984   Breast cancer Paternal Grandmother        >50   Hypertension Son    Diverticulosis Son    Dementia Maternal Aunt    Dementia Maternal Uncle    Colon cancer Neg Hx    Liver disease Neg Hx     Social History:  reports that she has never smoked. She has never used smokeless tobacco. She reports that she does not drink alcohol and does not use drugs.  ROS: All other review of systems were reviewed and are  negative except what is noted above in HPI  Physical Exam: BP (!) 171/74   Pulse 74   Constitutional:  Alert and oriented, No acute distress. HEENT: [place] AT, moist mucus membranes.  Trachea midline, no masses. Cardiovascular: No clubbing, cyanosis, or edema. Respiratory: Normal respiratory effort, no increased work of  breathing. GI: Abdomen is soft, nontender, nondistended, no abdominal masses GU: No CVA tenderness.  Lymph: No cervical or inguinal lymphadenopathy. Skin: No rashes, bruises or suspicious lesions. Neurologic: Grossly intact, no focal deficits, moving all 4 extremities. Psychiatric: Normal mood and affect.  Laboratory Data: Lab Results  Component Value Date   WBC 16.4 (H) 07/30/2022   HGB 12.7 07/30/2022   HCT 38.0 07/30/2022   MCV 91.6 07/30/2022   PLT 335 07/30/2022    Lab Results  Component Value Date   CREATININE 0.96 07/30/2022    No results found for: "PSA"  No results found for: "TESTOSTERONE"  No results found for: "HGBA1C"  Urinalysis    Component Value Date/Time   COLORURINE Straw 01/31/2013 1110   APPEARANCEUR Clear 01/31/2013 1110   LABSPEC 1.011 01/31/2013 1110   PHURINE 5.0 01/31/2013 1110   GLUCOSEU Negative 01/31/2013 1110   HGBUR 1+ 01/31/2013 1110   BILIRUBINUR Negative 01/31/2013 1110   KETONESUR Negative 01/31/2013 1110   PROTEINUR Negative 01/31/2013 1110   NITRITE Negative 01/31/2013 1110   LEUKOCYTESUR Negative 01/31/2013 1110    Lab Results  Component Value Date   BACTERIA NONE SEEN 01/31/2013    Pertinent Imaging: CT 07/30/2022 and KUB today: Images reviewed and discussed with the patient  No results found for this or any previous visit.  No results found for this or any previous visit.  No results found for this or any previous visit.  No results found for this or any previous visit.  No results found for this or any previous visit.  No valid procedures specified. No results found for this or any previous  visit.  No results found for this or any previous visit.   Assessment & Plan:    1. Kidney stones -dietary handout given -followup 6 months with KUB - Urinalysis, Routine w reflex microscopic   No follow-ups on file.  Nicolette Bang, MD  [facility] [place]

## 2022-09-05 NOTE — Patient Instructions (Signed)

## 2022-09-06 LAB — MICROSCOPIC EXAMINATION: Bacteria, UA: NONE SEEN

## 2022-09-06 LAB — URINALYSIS, ROUTINE W REFLEX MICROSCOPIC
Bilirubin, UA: NEGATIVE
Glucose, UA: NEGATIVE
Ketones, UA: NEGATIVE
Leukocytes,UA: NEGATIVE
Nitrite, UA: NEGATIVE
RBC, UA: NEGATIVE
Specific Gravity, UA: 1.03 (ref 1.005–1.030)
Urobilinogen, Ur: 0.2 mg/dL (ref 0.2–1.0)
pH, UA: 5.5 (ref 5.0–7.5)

## 2022-09-08 DIAGNOSIS — Z0279 Encounter for issue of other medical certificate: Secondary | ICD-10-CM | POA: Diagnosis not present

## 2022-09-26 DIAGNOSIS — K625 Hemorrhage of anus and rectum: Secondary | ICD-10-CM | POA: Diagnosis not present

## 2022-09-26 DIAGNOSIS — R194 Change in bowel habit: Secondary | ICD-10-CM | POA: Diagnosis not present

## 2022-09-26 DIAGNOSIS — Z83719 Family history of colon polyps, unspecified: Secondary | ICD-10-CM | POA: Diagnosis not present

## 2022-10-19 DIAGNOSIS — K644 Residual hemorrhoidal skin tags: Secondary | ICD-10-CM | POA: Diagnosis not present

## 2022-10-19 DIAGNOSIS — D128 Benign neoplasm of rectum: Secondary | ICD-10-CM | POA: Diagnosis not present

## 2022-10-19 DIAGNOSIS — D124 Benign neoplasm of descending colon: Secondary | ICD-10-CM | POA: Diagnosis not present

## 2022-10-19 DIAGNOSIS — K640 First degree hemorrhoids: Secondary | ICD-10-CM | POA: Diagnosis not present

## 2022-10-19 DIAGNOSIS — K625 Hemorrhage of anus and rectum: Secondary | ICD-10-CM | POA: Diagnosis not present

## 2022-10-19 LAB — HM COLONOSCOPY

## 2022-11-19 DIAGNOSIS — I10 Essential (primary) hypertension: Secondary | ICD-10-CM | POA: Diagnosis not present

## 2022-11-19 DIAGNOSIS — M5440 Lumbago with sciatica, unspecified side: Secondary | ICD-10-CM | POA: Diagnosis not present

## 2022-11-19 DIAGNOSIS — Z6838 Body mass index (BMI) 38.0-38.9, adult: Secondary | ICD-10-CM | POA: Diagnosis not present

## 2022-12-01 ENCOUNTER — Other Ambulatory Visit: Payer: Self-pay | Admitting: Family

## 2022-12-01 DIAGNOSIS — E559 Vitamin D deficiency, unspecified: Secondary | ICD-10-CM

## 2022-12-01 NOTE — Telephone Encounter (Signed)
Pharmacy requested refill Pended and sent to Dinah for approval.  

## 2023-01-17 ENCOUNTER — Other Ambulatory Visit: Payer: Self-pay | Admitting: Family

## 2023-01-17 DIAGNOSIS — E559 Vitamin D deficiency, unspecified: Secondary | ICD-10-CM

## 2023-01-22 ENCOUNTER — Ambulatory Visit: Payer: 59 | Admitting: Family

## 2023-01-23 ENCOUNTER — Ambulatory Visit (INDEPENDENT_AMBULATORY_CARE_PROVIDER_SITE_OTHER): Payer: 59 | Admitting: Family

## 2023-01-23 VITALS — BP 140/98 | HR 69 | Temp 97.3°F | Resp 17 | Ht 63.0 in | Wt 234.6 lb

## 2023-01-23 DIAGNOSIS — J4520 Mild intermittent asthma, uncomplicated: Secondary | ICD-10-CM | POA: Diagnosis not present

## 2023-01-23 DIAGNOSIS — I10 Essential (primary) hypertension: Secondary | ICD-10-CM

## 2023-01-23 DIAGNOSIS — E782 Mixed hyperlipidemia: Secondary | ICD-10-CM | POA: Diagnosis not present

## 2023-01-23 DIAGNOSIS — F418 Other specified anxiety disorders: Secondary | ICD-10-CM

## 2023-01-23 MED ORDER — AMLODIPINE BESYLATE 5 MG PO TABS: 5.0000 mg | ORAL_TABLET | Freq: Every day | ORAL | 1 refills | Status: DC

## 2023-01-23 NOTE — Progress Notes (Signed)
Provider: Richarda Blade FNP-C   Ramia Sidney, Donalee Citrin, NP  Patient Care Team: Mitzi Lilja, Donalee Citrin, NP as PCP - General (Family Medicine) Corbin Ade, MD (Gastroenterology)  Extended Emergency Contact Information Primary Emergency Contact: Dillard,Katrina Address: [address]          [place], [place] [address] [place] Home Phone: [phone] Relation: Sister  Code Status:  Full Code  Goals of care: Advanced Directive information    01/23/2023    2:15 PM  Advanced Directives  Does Patient Have a Medical Advance Directive? No  Would patient like information on creating a medical advance directive? No - Patient declined     No chief complaint on file.   HPI:  Pt is a 62 y.o. female seen today for 6 months follow up for medical management of chronic diseases.   She denies any acute issues.  Asthma -inhaler every once in a while.she denies any fever,chills,cough,fatigue,body aches,runny nose,chest tightness,chest pain,palpitation or shortness of breath.   Hypertension - B/p runs in the 130's -140's /90 she denies any headache,dizziness,vision changes,fatigue,chest tightness,palpitation,chest pain or shortness of breath.     Due for Tetanus ,shingles and COVID-19 booster vaccine.  Past Medical History:  Diagnosis Date   Acute pancreatitis 04/10/2011   ?secondary to HCTZ.    Anxiety    per new patient form   Arthritis    Asthma    Complication of anesthesia    pt. states that with previous surgery, she was difficult to get to sleep   GERD (gastroesophageal reflux disease)    H/O hiatal hernia    Headache    history of migraines   High cholesterol    Per new patient form   History of depression 07/10/2006   History of pneumonia 07/11/1995   Hyperlipidemia    stopped statins due to muscle pain   Hypertension    Hypothyroidism    pt. 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 Jeani Hawking. Per pt, colon polyps and due in five years for f/u.   DG  BONE DENSITY ([facility]X)  2020   Per new patient form   DIAGNOSTIC MAMMOGRAM  2021   Per new patient form   ESOPHAGOGASTRODUODENOSCOPY  1990s   duodenitis per patient   ESOPHAGOGASTRODUODENOSCOPY  05/24/2011   Procedure: ESOPHAGOGASTRODUODENOSCOPY (EGD);  Surgeon: Corbin Ade, MD;  [facility];  Service: Endoscopy;  Laterality: N/A;  1:05   REPLACEMENT TOTAL KNEE  Left 2021   Per new patient form   TONSILLECTOMY  1984   TOTAL ABDOMINAL HYSTERECTOMY  1991   TRIGGER FINGER RELEASE Right 2015   thumb    Allergies  Allergen Reactions   Percocet [Oxycodone-Acetaminophen] Itching    Terrible itching   Statins     Joints ache   Codeine Itching    Bad itching    Allergies as of 01/23/2023       Reactions   Percocet [oxycodone-acetaminophen] Itching   Terrible itching   Statins    Joints ache   Codeine Itching   Bad itching         Medication List        Accurate as of January 23, 2023 11:59 PM. If you have any questions, ask your nurse or doctor.          albuterol 108 (90 Base) MCG/ACT inhaler Commonly known as: VENTOLIN HFA Inhale 2 puffs into the lungs every 6 (six) hours as needed for wheezing or shortness of breath.   amLODipine 5 MG tablet Commonly known as: NORVASC Take 1 tablet (5 mg total) by mouth daily. Started by: Donalee Citrin Zareah Hunzeker   aspirin EC 81 MG tablet Take 1 tablet (81 mg total) by mouth daily. Swallow whole.   busPIRone 5 MG tablet Commonly known as: BUSPAR Take 1 tablet (5 mg total) by mouth 2 (two) times daily.   diphenhydrAMINE 25 MG tablet Commonly known as: SOMINEX Take 25-50 mg by mouth at bedtime as needed for sleep.   DUONEB IN Inhale 1 Inhaler into the lungs.   esomeprazole 20 MG capsule Commonly known as: NEXIUM Take 20 mg by mouth daily at 12 noon.   FISH OIL PO Take by mouth.   hydrALAZINE 50 MG tablet Commonly known as: APRESOLINE Take 1 tablet (50 mg total) by mouth 3 (three) times daily.   hydrochlorothiazide 12.5 MG capsule Commonly known as: MICROZIDE Take 1 capsule (12.5 mg total) by mouth daily.   IMODIUM PO Take by mouth as needed.   losartan 100 MG tablet Commonly known as: COZAAR Take 1 tablet (100 mg total) by mouth daily.   Melatonin 12 MG Tabs Take by mouth at bedtime as needed.   metoprolol succinate 100 MG 24 hr tablet Commonly known as: TOPROL-XL Take 1 tablet (100 mg total) by mouth daily. Take with or immediately following a meal.   sertraline 50 MG tablet Commonly known as: ZOLOFT Take 1 tablet (50 mg total) by mouth daily.   Vitamin D (Ergocalciferol) 1.25 MG (50000 UNIT) Caps capsule Commonly known as: DRISDOL TAKE 1 CAPSULE BY MOUTH TWICE A WEEK        Review of Systems  Constitutional:  Negative for appetite change, chills, fatigue, fever and unexpected weight change.  HENT:  Negative for congestion, dental problem,  ear discharge, ear pain, facial swelling, hearing loss, nosebleeds, postnasal drip, rhinorrhea, sinus pressure, sinus pain, sneezing, sore throat, tinnitus and trouble swallowing.   Eyes:  Negative for pain, discharge, redness, itching and visual disturbance.  Respiratory:  Negative for cough, chest tightness, shortness of breath and wheezing.   Cardiovascular:  Negative for chest pain, palpitations and leg swelling.  Gastrointestinal:  Negative for abdominal distention, abdominal pain, blood in stool, constipation, diarrhea, nausea and vomiting.  Endocrine: Negative for cold intolerance, heat intolerance, polydipsia, polyphagia and polyuria.  Genitourinary:  Negative for difficulty urinating, dysuria, flank pain, frequency and urgency.  Musculoskeletal:  Negative for arthralgias, back pain, gait problem, joint swelling,  myalgias, neck pain and neck stiffness.  Skin:  Negative for color change, pallor, rash and wound.  Neurological:  Negative for dizziness, syncope, speech difficulty, weakness, light-headedness, numbness and headaches.  Hematological:  Does not bruise/bleed easily.  Psychiatric/Behavioral:  Negative for agitation, behavioral problems, confusion, hallucinations, self-injury, sleep disturbance and suicidal ideas. The patient is not nervous/anxious.     Immunization History  Administered Date(s) Administered   Influenza Split 04/10/2016, 04/18/2018   Influenza-Unspecified 04/24/2017, 04/15/2019, 04/29/2020, 04/28/2021, 05/04/2022   Moderna Sars-Covid-2 Vaccination 09/25/2019, 10/23/2019   PPD Test 09/05/2022   Pfizer Covid-19 Vaccine Bivalent Booster 74yrs & up 11/30/2020   Tdap 07/11/2011   Pertinent  Health Maintenance Due  Topic Date Due   PAP SMEAR-Modifier  Never done   INFLUENZA VACCINE  02/08/2023   MAMMOGRAM  11/11/2023   Colonoscopy  10/18/2032      10/26/2021    2:21 PM 04/17/2022   10:47 AM 05/01/2022    7:00 AM 07/24/2022   11:13 AM 01/23/2023    2:15 PM   Fall Risk  Falls in the past year? 0   0 0  Was there an injury with Fall? 0   0 0  Fall Risk Category Calculator 0   0 0  Fall Risk Category (Retired) Low      (RETIRED) Patient Fall Risk Level Low fall risk Low fall risk Low fall risk    Patient at Risk for Falls Due to No Fall Risks   No Fall Risks History of fall(s)  Fall risk Follow up Falls evaluation completed   Falls evaluation completed Falls evaluation completed   Functional Status Survey:    Vitals:   01/23/23 1410  BP: (!) 140/98  Pulse: 69  Resp: 17  Temp: (!) 97.3 F (36.3 C)  SpO2: 95%  Weight: 234 lb 9.6 oz (106.4 kg)  Height: 5\' 3"  (1.6 m)   Body mass index is 41.56 kg/m. Physical Exam Vitals reviewed.  Constitutional:      General: She is not in acute distress.    Appearance: Normal appearance. She is obese. She is not ill-appearing or diaphoretic.  HENT:     Head: Normocephalic.     Right Ear: Tympanic membrane, ear canal and external ear normal. There is no impacted cerumen.     Left Ear: Tympanic membrane, ear canal and external ear normal. There is no impacted cerumen.     Nose: Nose normal. No congestion or rhinorrhea.     Mouth/Throat:     Mouth: Mucous membranes are moist.     Pharynx: Oropharynx is clear. No oropharyngeal exudate or posterior oropharyngeal erythema.  Eyes:     General: No scleral icterus.       Right eye: No discharge.        Left eye: No discharge.     Extraocular Movements: Extraocular movements intact.     Conjunctiva/sclera: Conjunctivae normal.     Pupils: Pupils are equal, round, and reactive to light.  Neck:     Vascular: No carotid bruit.  Cardiovascular:     Rate and Rhythm: Normal rate and regular rhythm.     Pulses: Normal pulses.     Heart sounds: Normal heart sounds. No murmur heard.    No friction rub. No gallop.  Pulmonary:     Effort: Pulmonary effort is normal. No respiratory distress.     Breath sounds: Normal breath sounds. No wheezing, rhonchi or  rales.  Chest:     Chest wall: No tenderness.  Abdominal:     General: Bowel sounds are normal. There is no distension.     Palpations: Abdomen is soft. There is no mass.     Tenderness: There is no abdominal tenderness. There is no right CVA tenderness, left CVA tenderness, guarding or rebound.  Musculoskeletal:        General: No swelling or tenderness. Normal range of motion.     Cervical back: Normal range of motion. No rigidity or tenderness.     Right lower leg: No edema.     Left lower leg: No edema.  Lymphadenopathy:     Cervical: No cervical adenopathy.  Skin:    General: Skin is warm and dry.     Coloration: Skin is not pale.     Findings: No bruising, erythema, lesion or rash.  Neurological:     Mental Status: She is alert and oriented to person, place, and time.     Cranial Nerves: No cranial nerve deficit.     Sensory: No sensory deficit.     Motor: No weakness.     Coordination: Coordination normal.     Gait: Gait normal.  Psychiatric:        Mood and Affect: Mood normal.        Speech: Speech normal.        Behavior: Behavior normal.        Thought Content: Thought content normal.        Judgment: Judgment normal.     Labs reviewed: Recent Labs    07/24/22 1205 07/30/22 1810  NA 142 140  K 3.5 3.0*  CL 102 104  CO2 25 24  GLUCOSE 90 126*  BUN 17 16  CREATININE 0.91 0.96  CALCIUM 10.1 9.4   Recent Labs    07/24/22 1205 07/30/22 1810  AST 18 36  ALT 8 14  ALKPHOS  --  64  BILITOT 0.8 1.0  PROT 7.5 7.5  ALBUMIN  --  4.2   Recent Labs    07/24/22 1205 07/30/22 1810  WBC 9.2 16.4*  NEUTROABS 6,477 14.0*  HGB 13.1 12.7  HCT 39.0 38.0  MCV 92.4 91.6  PLT 291 335   Lab Results  Component Value Date   TSH 1.46 07/24/2022   No results found for: "HGBA1C" Lab Results  Component Value Date   CHOL 310 (H) 07/24/2022   HDL 62 07/24/2022   LDLCALC 220 (H) 07/24/2022   TRIG 130 07/24/2022   CHOLHDL 5.0 (H) 07/24/2022    Significant  Diagnostic Results in last 30 days:  No results found.  Assessment/Plan 1. Essential hypertension Home B/p stable slightly high this visit. - start on amlodipine as below side effects discussed  - continue on hydralazine,hydrochlorothiazide,losartan and metoprolol - Lipid panel; Future - TSH; Future - COMPLETE METABOLIC PANEL WITH GFR; Future - CBC with Differential/Platelet; Future - amLODipine (NORVASC) 5 MG tablet; Take 1 tablet (5 mg total) by mouth daily.  Dispense: 30 tablet; Refill: 1  2. Mixed hyperlipidemia LDL not at goal  - dietary modification and exercise advised  - Lipid panel; Future  3. Depression with anxiety Mood stable  - continue on sertraline   4. Mild intermittent asthma without complication Stable  - continue on albuterol as needed   Family/ staff Communication: Reviewed plan of care with patient verbalized understanding   Labs/tests ordered:  - Lipid panel; Future - TSH; Future - COMPLETE METABOLIC PANEL WITH GFR; Future - CBC with Differential/Platelet; Future  Next Appointment : Return  in about 6 months (around 07/26/2023) for medical mangement of chronic issues.Fasting labs soon 2 weeks for Blood pressure check.   Caesar Bookman, NP

## 2023-01-26 ENCOUNTER — Other Ambulatory Visit: Payer: 59

## 2023-01-26 ENCOUNTER — Other Ambulatory Visit: Payer: Self-pay | Admitting: Family

## 2023-01-26 DIAGNOSIS — E782 Mixed hyperlipidemia: Secondary | ICD-10-CM | POA: Diagnosis not present

## 2023-01-26 DIAGNOSIS — I10 Essential (primary) hypertension: Secondary | ICD-10-CM

## 2023-01-26 DIAGNOSIS — F418 Other specified anxiety disorders: Secondary | ICD-10-CM | POA: Diagnosis not present

## 2023-01-27 LAB — LIPID PANEL
Cholesterol: 300 mg/dL — ABNORMAL HIGH (ref <200)
HDL: 66 mg/dL (ref >=50)
LDL Cholesterol (Calc): 208 mg/dL (calc) — ABNORMAL HIGH
Non-HDL Cholesterol (Calc): 234 mg/dL (calc) — ABNORMAL HIGH (ref <130)
Total CHOL/HDL Ratio: 4.5 (calc) (ref <5.0)
Triglycerides: 118 mg/dL (ref <150)

## 2023-01-27 LAB — CBC WITH DIFFERENTIAL/PLATELET
Absolute Monocytes: 833 cells/uL (ref 200–950)
Basophils Absolute: 39 cells/uL (ref 0–200)
Basophils Relative: 0.4 %
Eosinophils Absolute: 167 cells/uL (ref 15–500)
Eosinophils Relative: 1.7 %
HCT: 40.1 % (ref 35.0–45.0)
Hemoglobin: 13.1 g/dL (ref 11.7–15.5)
Lymphs Abs: 1392 cells/uL (ref 850–3900)
MCH: 31.3 pg (ref 27.0–33.0)
MCHC: 32.7 g/dL (ref 32.0–36.0)
MCV: 95.7 fL (ref 80.0–100.0)
MPV: 11.7 fL (ref 7.5–12.5)
Monocytes Relative: 8.5 %
Neutro Abs: 7370 cells/uL (ref 1500–7800)
Neutrophils Relative %: 75.2 %
Platelets: 245 10*3/uL (ref 140–400)
RBC: 4.19 10*6/uL (ref 3.80–5.10)
RDW: 13.2 % (ref 11.0–15.0)
Total Lymphocyte: 14.2 %
WBC: 9.8 10*3/uL (ref 3.8–10.8)

## 2023-01-27 LAB — COMPLETE METABOLIC PANEL WITH GFR
AG Ratio: 1.6 (calc) (ref 1.0–2.5)
ALT: 8 U/L (ref 6–29)
AST: 16 U/L (ref 10–35)
Albumin: 4.2 g/dL (ref 3.6–5.1)
Alkaline phosphatase (APISO): 75 U/L (ref 37–153)
BUN: 10 mg/dL (ref 7–25)
CO2: 27 mmol/L (ref 20–32)
Calcium: 9.6 mg/dL (ref 8.6–10.4)
Chloride: 103 mmol/L (ref 98–110)
Creat: 0.79 mg/dL (ref 0.50–1.05)
Globulin: 2.7 g/dL (calc) (ref 1.9–3.7)
Glucose, Bld: 106 mg/dL — ABNORMAL HIGH (ref 65–99)
Potassium: 3.6 mmol/L (ref 3.5–5.3)
Sodium: 140 mmol/L (ref 135–146)
Total Bilirubin: 0.8 mg/dL (ref 0.2–1.2)
Total Protein: 6.9 g/dL (ref 6.1–8.1)
eGFR: 83 mL/min/{1.73_m2} (ref >=60)

## 2023-01-27 LAB — TSH: TSH: 1.34 mIU/L (ref 0.40–4.50)

## 2023-01-30 ENCOUNTER — Other Ambulatory Visit: Payer: Self-pay | Admitting: Family

## 2023-01-30 DIAGNOSIS — M1000 Idiopathic gout, unspecified site: Secondary | ICD-10-CM

## 2023-01-30 MED ORDER — COLCHICINE 0.6 MG PO TABS: ORAL_TABLET | ORAL | 0 refills | Status: AC

## 2023-01-30 NOTE — Telephone Encounter (Signed)
Message sent to Nget[place], Donalee Citrin, NP to reply directly to patient

## 2023-01-30 NOTE — Progress Notes (Signed)
Colchicine script send to pharmacy as requested.

## 2023-02-06 ENCOUNTER — Ambulatory Visit: Payer: 59 | Admitting: Family

## 2023-02-13 ENCOUNTER — Ambulatory Visit (INDEPENDENT_AMBULATORY_CARE_PROVIDER_SITE_OTHER): Payer: 59 | Admitting: Family

## 2023-02-13 ENCOUNTER — Encounter: Payer: Self-pay | Admitting: Family

## 2023-02-13 VITALS — BP 124/72 | HR 71 | Temp 98.6°F | Resp 18 | Ht 63.0 in | Wt 228.0 lb

## 2023-02-13 DIAGNOSIS — E782 Mixed hyperlipidemia: Secondary | ICD-10-CM | POA: Diagnosis not present

## 2023-02-13 DIAGNOSIS — I10 Essential (primary) hypertension: Secondary | ICD-10-CM | POA: Diagnosis not present

## 2023-02-13 MED ORDER — HYDRALAZINE HCL 50 MG PO TABS: 50.0000 mg | ORAL_TABLET | Freq: Every day | ORAL | Status: AC

## 2023-02-13 MED ORDER — LOSARTAN POTASSIUM 100 MG PO TABS: 100.0000 mg | ORAL_TABLET | Freq: Every day | ORAL | 1 refills | Status: DC

## 2023-02-13 MED ORDER — EZETIMIBE 10 MG PO TABS: 10.0000 mg | ORAL_TABLET | Freq: Every day | ORAL | 3 refills | Status: AC

## 2023-02-13 MED ORDER — METOPROLOL SUCCINATE ER 100 MG PO TB24: 100.0000 mg | ORAL_TABLET | Freq: Every day | ORAL | 1 refills | Status: DC

## 2023-02-13 NOTE — Progress Notes (Signed)
Provider: Richarda Blade FNP-C  Jayla Mackie, Donalee Citrin, NP  Patient Care Team: Falcon Mccaskey, Donalee Citrin, NP as PCP - General (Family Medicine) Corbin Ade, MD (Gastroenterology)  Extended Emergency Contact Information Primary Emergency Contact: Dillard,Katrina Address: [address]          [place], [place] [address] [place] Home Phone: [phone] Relation: Sister  Code Status: Full Code Goals of care: Advanced Directive information    01/23/2023    2:15 PM  Advanced Directives  Does Patient Have a Medical Advance Directive? No  Would patient like information on creating a medical advance directive? No - Patient declined     Chief Complaint  Patient presents with   Medical Management of Chronic Issues    Patient is here for a BP check and to review labs    HPI:  Pt is a 62 y.o. female seen today for an acute visit for evaluation of follow up Blood pressure and discuss lab results. Her blood pressure readings have been within normal range at home in the 110's/60's - 130's/70's with normal HR. states had 1 episode while driving her blood pressure dropped to 86/62 and felt dizzy after starting on hydralazine 50 mg 3 times daily. She reduced hydralazine to once a day with much improvement of her symptoms. She denies any headache,dizziness,vision changes,fatigue,chest tightness,palpitation,chest pain or shortness of breath.     Recent lab results reviewed and discussed during visit.  All labs well unremarkable except Total cholesterol 300, triglyceride 118 and LDL 208 [previous total chol 310,TRG 130 and LDL 220).  Patient declined statin in the past due to joint pain.  Discussed referral to hyperlipidemia clinic but states would like to try Zetia again but she has taken in the past.  Dietary modification and exercise advised    Past Medical History:  Diagnosis Date   Acute pancreatitis 04/10/2011   ?secondary to HCTZ.    Anxiety    per new patient form    Arthritis    Asthma    Complication of anesthesia    pt. states that with previous surgery, she was difficult to get to sleep   GERD (gastroesophageal reflux disease)    H/O hiatal hernia    Headache    history of migraines   High cholesterol    Per new patient form   History of depression 07/10/2006   History of pneumonia 07/11/1995   Hyperlipidemia    stopped statins due to muscle pain   Hypertension    Hypothyroidism    pt. 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 Jeani Hawking. Per pt, colon polyps and due in five years for f/u.   DG  BONE DENSITY ([facility] HX)  2020   Per new patient form   DIAGNOSTIC MAMMOGRAM  2021   Per new patient form   ESOPHAGOGASTRODUODENOSCOPY  1990s   duodenitis per patient   ESOPHAGOGASTRODUODENOSCOPY  05/24/2011   Procedure: ESOPHAGOGASTRODUODENOSCOPY (EGD);  Surgeon: Corbin Ade, MD;  [facility];  Service: Endoscopy;   Laterality: N/A;  1:05   REPLACEMENT TOTAL KNEE Left 2021   Per new patient form   TONSILLECTOMY  1984   TOTAL ABDOMINAL HYSTERECTOMY  1991   TRIGGER FINGER RELEASE Right 2015   thumb    Allergies  Allergen Reactions   Percocet [Oxycodone-Acetaminophen] Itching    Terrible itching   Statins     Joints ache   Codeine Itching    Bad itching    Outpatient Encounter Medications as of 02/13/2023  Medication Sig   albuterol (VENTOLIN HFA) 108 (90 Base) MCG/ACT inhaler Inhale 2 puffs into the lungs every 6 (six) hours as needed for wheezing or shortness of breath.   amLODipine (NORVASC) 5 MG tablet Take 1 tablet (5 mg total) by mouth daily.   aspirin EC 81 MG tablet Take 1 tablet (81 mg total) by mouth daily. Swallow whole.   busPIRone (BUSPAR) 5 MG tablet Take 1 tablet (5 mg total) by mouth 2 (two) times daily.   colchicine 0.6 MG tablet Take 1.2 mg tablet by mouth x 1 dose then 0.6 mg tablet one hour later maximum 1.8 mg total.   diphenhydrAMINE (SOMINEX) 25 MG tablet Take 25-50 mg by mouth at bedtime as needed for sleep.   esomeprazole (NEXIUM) 20 MG capsule Take 20 mg by mouth daily at 12 noon.   hydrochlorothiazide (MICROZIDE) 12.5 MG capsule Take 1 capsule (12.5 mg total) by mouth daily.   Ipratropium-Albuterol (DUONEB IN) Inhale 1 Inhaler into the lungs.   Loperamide HCl (IMODIUM PO) Take by mouth as needed.   losartan (COZAAR) 100 MG tablet Take 1 tablet (100 mg total) by mouth daily.   Melatonin 12 MG TABS Take by mouth at bedtime as needed.   metoprolol succinate (TOPROL-XL) 100 MG 24 hr tablet Take 1 tablet (100 mg total) by mouth daily. Take with or immediately following a meal.   Omega-3 Fatty Acids (FISH OIL PO) Take by mouth.   sertraline (ZOLOFT) 50 MG tablet Take 1 tablet (50 mg total) by mouth daily.   Vitamin D, Ergocalciferol, (DRISDOL) 1.25 MG (50000 UNIT) CAPS capsule TAKE 1 CAPSULE BY MOUTH TWICE A WEEK   hydrALAZINE (APRESOLINE) 50 MG tablet Take 1 tablet (50 mg  total) by mouth 3 (three) times daily.   No facility-administered encounter medications on file as of 02/13/2023.    Review of Systems  Constitutional:  Negative for appetite change, chills, fatigue, fever and unexpected weight change.  Eyes:  Negative for pain, discharge, redness, itching and visual disturbance.  Respiratory:  Negative for cough, chest tightness, shortness of breath and wheezing.   Cardiovascular:  Negative for chest pain, palpitations and leg swelling.  Gastrointestinal:  Negative for abdominal distention, abdominal pain, blood in stool, constipation, diarrhea, nausea and vomiting.  Genitourinary:  Negative for difficulty urinating, dysuria, flank pain, frequency and urgency.  Musculoskeletal:  Negative for arthralgias, back pain, gait problem, joint swelling, myalgias, neck pain and neck stiffness.  Skin:  Negative for color change, pallor, rash and wound.  Neurological:  Negative for dizziness, syncope, speech difficulty, weakness, light-headedness, numbness and headaches.  Hematological:  Does not bruise/bleed easily.  Psychiatric/Behavioral:  Negative for agitation, behavioral problems, confusion, hallucinations and sleep disturbance. The patient is not nervous/anxious.     Immunization History  Administered Date(s) Administered   Influenza Split 04/10/2016, 04/18/2018   Influenza-Unspecified 04/24/2017, 04/15/2019, 04/29/2020, 04/28/2021, 05/04/2022   Moderna Sars-Covid-2 Vaccination 09/25/2019, 10/23/2019   PPD Test 09/05/2022   Pfizer Covid-19 Vaccine Bivalent Booster 39yrs & up 11/30/2020   Tdap 07/11/2011   Pertinent  Health Maintenance Due  Topic Date Due   PAP SMEAR-Modifier  Never done   INFLUENZA VACCINE  02/08/2023   MAMMOGRAM  11/11/2023   Colonoscopy  10/18/2032      10/26/2021    2:21 PM 04/17/2022   10:47 AM 05/01/2022    7:00 AM 07/24/2022   11:13 AM 01/23/2023    2:15 PM  Fall Risk  Falls in the past year? 0   0 0  Was there an injury with  Fall? 0   0 0  Fall Risk Category Calculator 0   0 0  Fall Risk Category (Retired) Low      (RETIRED) Patient Fall Risk Level Low fall risk Low fall risk Low fall risk    Patient at Risk for Falls Due to No Fall Risks   No Fall Risks History of fall(s)  Fall risk Follow up Falls evaluation completed   Falls evaluation completed Falls evaluation completed   Functional Status Survey:    Vitals:   02/13/23 1521  BP: 124/72  Pulse: 71  Resp: 18  Temp: 98.6 F (37 C)  SpO2: 97%  Weight: 228 lb (103.4 kg)  Height: 5\' 3"  (1.6 m)   Body mass index is 40.39 kg/m. Physical Exam Vitals reviewed.  Constitutional:      General: She is not in acute distress.    Appearance: Normal appearance. She is obese. She is not ill-appearing or diaphoretic.  HENT:     Head: Normocephalic.     Nose: Nose normal. No congestion or rhinorrhea.     Mouth/Throat:     Mouth: Mucous membranes are moist.     Pharynx: Oropharynx is clear. No oropharyngeal exudate or posterior oropharyngeal erythema.  Eyes:     General: No scleral icterus.       Right eye: No discharge.        Left eye: No discharge.     Extraocular Movements: Extraocular movements intact.     Conjunctiva/sclera: Conjunctivae normal.     Pupils: Pupils are equal, round, and reactive to light.  Neck:     Vascular: No carotid bruit.  Cardiovascular:     Rate and Rhythm: Normal rate and regular rhythm.     Pulses: Normal pulses.     Heart sounds: Normal heart sounds. No murmur heard.    No friction rub. No gallop.  Pulmonary:     Effort: Pulmonary effort is normal. No respiratory distress.     Breath sounds: Normal breath sounds. No wheezing, rhonchi or rales.  Chest:     Chest wall: No tenderness.  Abdominal:     General: Bowel sounds are normal. There is no distension.     Palpations: Abdomen is soft. There is no mass.     Tenderness: There is no abdominal tenderness. There is no right CVA tenderness, left CVA tenderness, guarding  or rebound.  Musculoskeletal:  General: No swelling or tenderness. Normal range of motion.     Cervical back: Normal range of motion. No rigidity or tenderness.     Right lower leg: No edema.     Left lower leg: No edema.  Lymphadenopathy:     Cervical: No cervical adenopathy.  Skin:    General: Skin is warm and dry.     Coloration: Skin is not pale.     Findings: No bruising, erythema, lesion or rash.  Neurological:     Mental Status: She is alert and oriented to person, place, and time.     Cranial Nerves: No cranial nerve deficit.     Sensory: No sensory deficit.     Motor: No weakness.     Coordination: Coordination normal.     Gait: Gait abnormal.  Psychiatric:        Mood and Affect: Mood normal.        Speech: Speech normal.        Behavior: Behavior normal.     Labs reviewed: Recent Labs    07/24/22 1205 07/30/22 1810 01/26/23 1100  NA 142 140 140  K 3.5 3.0* 3.6  CL 102 104 103  CO2 25 24 27   GLUCOSE 90 126* 106*  BUN 17 16 10   CREATININE 0.91 0.96 0.79  CALCIUM 10.1 9.4 9.6   Recent Labs    07/24/22 1205 07/30/22 1810 01/26/23 1100  AST 18 36 16  ALT 8 14 8   ALKPHOS  --  64  --   BILITOT 0.8 1.0 0.8  PROT 7.5 7.5 6.9  ALBUMIN  --  4.2  --    Recent Labs    07/24/22 1205 07/30/22 1810 01/26/23 1100  WBC 9.2 16.4* 9.8  NEUTROABS 6,477 14.0* 7,370  HGB 13.1 12.7 13.1  HCT 39.0 38.0 40.1  MCV 92.4 91.6 95.7  PLT 291 335 245   Lab Results  Component Value Date   TSH 1.34 01/26/2023   No results found for: "HGBA1C" Lab Results  Component Value Date   CHOL 300 (H) 01/26/2023   HDL 66 01/26/2023   LDLCALC 208 (H) 01/26/2023   TRIG 118 01/26/2023   CHOLHDL 4.5 01/26/2023    Significant Diagnostic Results in last 30 days:  No results found.  Assessment/Plan 1. Mixed hyperlipidemia Total cholesterol and LDL elevated higher than previous.  - - Has had intolerance to statin. - Start on Zetia 10 mg tablet daily -Consider referral  to cardiologist if unable to tolerate Zetia  -Continue with dietary modification and exercise -Will recheck fasting lipid panel in 4 months - ezetimibe (ZETIA) 10 MG tablet; Take 1 tablet (10 mg total) by mouth daily.  Dispense: 90 tablet; Refill: 3  2. Essential hypertension Blood pressure has improved to normal range both at home and here today -Continue on amlodipine, Metroprolol, losartan, hydrochlorothiazide and hydralazine - hydrALAZINE (APRESOLINE) 50 MG tablet; Take 1 tablet (50 mg total) by mouth daily. - losartan (COZAAR) 100 MG tablet; Take 1 tablet (100 mg total) by mouth daily.  Dispense: 90 tablet; Refill: 1 - metoprolol succinate (TOPROL-XL) 100 MG 24 hr tablet; Take 1 tablet (100 mg total) by mouth daily. Take with or immediately following a meal.  Dispense: 90 tablet; Refill: 1  Family/ staff Communication: Reviewed plan of care with patient verbalized understanding  Labs/tests ordered: None   Next Appointment: Return in about 4 months (around 06/15/2023) for Fasting Lipid panel in 4 months.Hepatic panel in 4 weeks , medical mangement of  chronic issues.Caesar Bookman, NP

## 2023-02-28 ENCOUNTER — Ambulatory Visit (HOSPITAL_COMMUNITY)
Admission: RE | Admit: 2023-02-28 | Discharge: 2023-02-28 | Disposition: A | Discharge status: Home / Self Care | Payer: 59 | Source: Ambulatory Visit | Attending: Urology | Admitting: Urology

## 2023-02-28 DIAGNOSIS — N200 Calculus of kidney: Secondary | ICD-10-CM | POA: Diagnosis not present

## 2023-02-28 DIAGNOSIS — Z0389 Encounter for observation for other suspected diseases and conditions ruled out: Secondary | ICD-10-CM | POA: Diagnosis not present

## 2023-03-07 ENCOUNTER — Other Ambulatory Visit: Payer: Self-pay

## 2023-03-07 ENCOUNTER — Ambulatory Visit: Payer: 59 | Admitting: Urology

## 2023-03-07 VITALS — BP 135/79 | HR 72

## 2023-03-07 DIAGNOSIS — I10 Essential (primary) hypertension: Secondary | ICD-10-CM

## 2023-03-07 DIAGNOSIS — N200 Calculus of kidney: Secondary | ICD-10-CM

## 2023-03-07 DIAGNOSIS — Z09 Encounter for follow-up examination after completed treatment for conditions other than malignant neoplasm: Secondary | ICD-10-CM | POA: Diagnosis not present

## 2023-03-07 DIAGNOSIS — E782 Mixed hyperlipidemia: Secondary | ICD-10-CM

## 2023-03-07 DIAGNOSIS — Z87442 Personal history of urinary calculi: Secondary | ICD-10-CM | POA: Diagnosis not present

## 2023-03-07 NOTE — Patient Instructions (Signed)

## 2023-03-07 NOTE — Progress Notes (Unsigned)
03/07/2023 2:19 PM   Arlenis Karleen Hampshire 04-13-1961 [id number]  Referring provider: Caesar Bookman, NP [address] [place],  [place] [address]  No chief complaint on file.   HPI: Renal US shows no calculi. No worsening LUTS. No stone events since last visit. She drinks 32oz of water daily   PMH: Past Medical History:  Diagnosis Date   Acute pancreatitis 04/10/2011   ?secondary to HCTZ.    Anxiety    per new patient form   Arthritis    Asthma    Complication of anesthesia    pt. states that with previous surgery, she was difficult to get to sleep   GERD (gastroesophageal reflux disease)    H/O hiatal hernia    Headache    history of migraines   High cholesterol    Per new patient form   History of depression 07/10/2006   History of pneumonia 07/11/1995   Hyperlipidemia    stopped statins due to muscle pain   Hypertension    Hypothyroidism    pt. 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 Jeani Hawking. Per pt, colon polyps and due in five years for f/u.   DG  BONE DENSITY ([facility] HX)  2020   Per new patient form   DIAGNOSTIC MAMMOGRAM  2021   Per new patient form   ESOPHAGOGASTRODUODENOSCOPY  1990s   duodenitis per patient   ESOPHAGOGASTRODUODENOSCOPY  05/24/2011   Procedure: ESOPHAGOGASTRODUODENOSCOPY (EGD);  Surgeon: Corbin Ade, MD;  [facility];  Service: Endoscopy;  Laterality: N/A;  1:05   REPLACEMENT TOTAL KNEE Left 2021   Per new patient form   TONSILLECTOMY  1984   TOTAL ABDOMINAL HYSTERECTOMY  1991   TRIGGER FINGER RELEASE Right 2015   thumb    Home Medications:  Allergies as of 03/07/2023       Reactions   Percocet [oxycodone-acetaminophen] Itching   Terrible itching   Statins    Joints ache   Codeine Itching   Bad itching        Medication List        Accurate as of March 07, 2023  2:19 PM. If you have any questions, ask your nurse or doctor.          albuterol 108 (90 Base) MCG/ACT inhaler Commonly known as: VENTOLIN HFA Inhale 2 puffs into the lungs every 6 (six) hours as needed for wheezing or shortness of breath.   amLODipine  5 MG tablet Commonly known as: NORVASC Take 1 tablet (5 mg total) by mouth daily.   aspirin EC 81 MG tablet Take 1 tablet (81 mg total) by mouth daily. Swallow whole.   busPIRone 5 MG tablet Commonly known as: BUSPAR Take 1 tablet (5 mg total) by mouth 2 (two) times daily.   colchicine 0.6 MG tablet Take 1.2 mg tablet by mouth x 1 dose then 0.6 mg tablet one hour later maximum 1.8 mg total.   diphenhydrAMINE 25 MG tablet Commonly known as: SOMINEX Take 25-50 mg by mouth at bedtime as needed for sleep.   DUONEB IN Inhale 1 Inhaler into the lungs.   esomeprazole 20 MG capsule Commonly known as: NEXIUM Take 20 mg by mouth daily at 12 noon.   ezetimibe 10 MG tablet Commonly known as: Zetia Take 1 tablet (10 mg total) by  mouth daily.   FISH OIL PO Take by mouth.   hydrALAZINE 50 MG tablet Commonly known as: APRESOLINE Take 1 tablet (50 mg total) by mouth daily.   hydrochlorothiazide 12.5 MG capsule Commonly known as: MICROZIDE Take 1 capsule (12.5 mg total) by mouth daily.   IMODIUM PO Take by mouth as needed.   losartan 100 MG tablet Commonly known as: COZAAR Take 1 tablet (100 mg total) by mouth daily.   Melatonin 12 MG Tabs Take by mouth at bedtime as needed.   metoprolol succinate 100 MG 24 hr tablet Commonly known as: TOPROL-XL Take 1 tablet (100 mg total) by mouth daily. Take with or immediately following a meal.   sertraline 50 MG tablet Commonly known as: ZOLOFT Take 1 tablet (50 mg total) by mouth daily.   Vitamin D (Ergocalciferol) 1.25 MG (50000 UNIT) Caps capsule Commonly known as: DRISDOL TAKE 1 CAPSULE BY MOUTH TWICE A WEEK        Allergies:  Allergies  Allergen Reactions   Percocet [Oxycodone-Acetaminophen] Itching    Terrible itching   Statins     Joints ache   Codeine Itching    Bad itching    Family History: Family History  Problem Relation Age of Onset   Hypertension Mother    Heart attack Mother        2008   Ulcerative colitis Mother    Arthritis Mother    Arthritis Father    Cancer Father    Hypertension Father    Hyperlipidemia Father    Cancer Sister    Hypertension Sister    High Cholesterol Sister    Hypertension Sister    Diverticulosis Sister    High Cholesterol Brother    Hypertension Brother    Cancer Maternal Grandmother    Stroke Maternal Grandfather        1984   Breast cancer Paternal Grandmother        >50   Hypertension Son    Diverticulosis Son    Dementia Maternal Aunt    Dementia Maternal Uncle    Colon cancer Neg Hx    Liver disease Neg Hx     Social History:  reports that she has never smoked. She has never used smokeless tobacco. She reports that she does not drink alcohol and does not use drugs.  ROS: All  other review of systems were reviewed and are negative except what is noted above in HPI  Physical Exam: BP 135/79   Pulse 72   Constitutional:  Alert and oriented, No acute distress. HEENT: [place] AT, moist mucus membranes.  Trachea midline, no masses.  Cardiovascular: No clubbing, cyanosis, or edema. Respiratory: Normal respiratory effort, no increased work of breathing. GI: Abdomen is soft, nontender, nondistended, no abdominal masses GU: No CVA tenderness.  Lymph: No cervical or inguinal lymphadenopathy. Skin: No rashes, bruises or suspicious lesions. Neurologic: Grossly intact, no focal deficits, moving all 4 extremities. Psychiatric: Normal mood and affect.  Laboratory Data: Lab Results  Component Value Date   WBC 9.8 01/26/2023   HGB 13.1 01/26/2023   HCT 40.1 01/26/2023   MCV 95.7 01/26/2023   PLT 245 01/26/2023    Lab Results  Component Value Date   CREATININE 0.79 01/26/2023    No results found for: "PSA"  No results found for: "TESTOSTERONE"  No results found for: "HGBA1C"  Urinalysis    Component Value Date/Time   COLORURINE Straw 01/31/2013 1110   APPEARANCEUR Hazy (A) 09/05/2022 1443   LABSPEC 1.011 01/31/2013 1110   PHURINE 5.0 01/31/2013 1110   GLUCOSEU Negative 09/05/2022 1443   GLUCOSEU Negative 01/31/2013 1110   HGBUR 1+ 01/31/2013 1110   BILIRUBINUR Negative 09/05/2022 1443   BILIRUBINUR Negative 01/31/2013 1110   KETONESUR Negative 01/31/2013 1110   PROTEINUR 1+ (A) 09/05/2022 1443   PROTEINUR Negative 01/31/2013 1110   NITRITE Negative 09/05/2022 1443   NITRITE Negative 01/31/2013 1110   LEUKOCYTESUR Negative 09/05/2022 1443   LEUKOCYTESUR Negative 01/31/2013 1110    Lab Results  Component Value Date   LABMICR See below: 09/05/2022   WBCUA 0-5 09/05/2022   LABEPIT 0-10 09/05/2022   MUCUS Present (A) 09/05/2022   BACTERIA None seen 09/05/2022    Pertinent Imaging: *** Results for orders placed in visit on 09/05/22  DG Abd 1  View  Narrative CLINICAL DATA:  Abdominal pain.  EXAM: ABDOMEN - 1 VIEW  COMPARISON:  None Available.  FINDINGS: The bowel gas pattern is normal without gaseous distention. Increased stool noted consistent with constipation.  Punctate calcifications identified at the L4-5 level on the left which could be in the left ureter. Noncontrast CT is recommended to evaluate this finding.  IMPRESSION: Constipation. Possible mid left ureteral stones. Noncontrast CT recommended for further evaluation.   Electronically Signed By: Layla Maw M.D. On: 09/07/2022 15:33  No results found for this or any previous visit.  No results found for this or any previous visit.  No results found for this or any previous visit.  Results for orders placed during the hospital encounter of 02/28/23  Ultrasound renal complete  Narrative CLINICAL DATA:  Nephrolithiasis.  EXAM: RENAL / URINARY TRACT ULTRASOUND COMPLETE  COMPARISON:  None Available.  FINDINGS: Right Kidney:  Length: 9.7 cm. Echogenicity within normal limits. No mass or hydronephrosis visualized.  Left Kidney:  Length: 10.7 cm. Echogenicity within normal limits. No mass or hydronephrosis visualized.  Bladder:  Appears normal for degree of bladder distention.   Electronically Signed By: Layla Maw M.D. On: 03/07/2023 00:15  No valid procedures specified. No results found for this or any previous visit.  No results found for this or any previous visit.   Assessment & Plan:    1. Kidney stones *** - Urinalysis, Routine w reflex microscopic   No follow-ups on file.  Wilkie Aye, MD  [facility] [place]

## 2023-03-08 ENCOUNTER — Other Ambulatory Visit: Payer: Self-pay | Admitting: Family

## 2023-03-08 ENCOUNTER — Encounter: Payer: Self-pay | Admitting: Urology

## 2023-03-08 DIAGNOSIS — F418 Other specified anxiety disorders: Secondary | ICD-10-CM

## 2023-03-08 LAB — URINALYSIS, ROUTINE W REFLEX MICROSCOPIC
Bilirubin, UA: NEGATIVE
Glucose, UA: NEGATIVE
Ketones, UA: NEGATIVE
Leukocytes,UA: NEGATIVE
Nitrite, UA: NEGATIVE
Protein,UA: NEGATIVE
RBC, UA: NEGATIVE
Specific Gravity, UA: 1.03 (ref 1.005–1.030)
Urobilinogen, Ur: 0.2 mg/dL (ref 0.2–1.0)
pH, UA: 5.5 (ref 5.0–7.5)

## 2023-03-13 ENCOUNTER — Other Ambulatory Visit: Payer: 59

## 2023-03-13 DIAGNOSIS — E782 Mixed hyperlipidemia: Secondary | ICD-10-CM

## 2023-03-14 LAB — LIPID PANEL
Cholesterol: 240 mg/dL — ABNORMAL HIGH (ref <200)
HDL: 54 mg/dL (ref >=50)
LDL Cholesterol (Calc): 155 mg/dL — ABNORMAL HIGH
Non-HDL Cholesterol (Calc): 186 mg/dL — ABNORMAL HIGH (ref <130)
Total CHOL/HDL Ratio: 4.4 (calc) (ref <5.0)
Triglycerides: 169 mg/dL — ABNORMAL HIGH (ref <150)

## 2023-03-16 IMAGING — MG MM DIGITAL SCREENING BILAT W/ TOMO AND CAD
6 of 10 series · 6 of 30 positions shown · non-contrast
Comparison: Previous exam(s).

CLINICAL DATA: Screening.

EXAM:
DIGITAL SCREENING BILATERAL MAMMOGRAM WITH TOMOSYNTHESIS AND CAD
TECHNIQUE: Bilateral screening digital craniocaudal and mediolateral oblique
mammograms were obtained. Bilateral screening digital breast
tomosynthesis was performed. The images were evaluated with
computer-aided detection.

[L CC synth-2D]
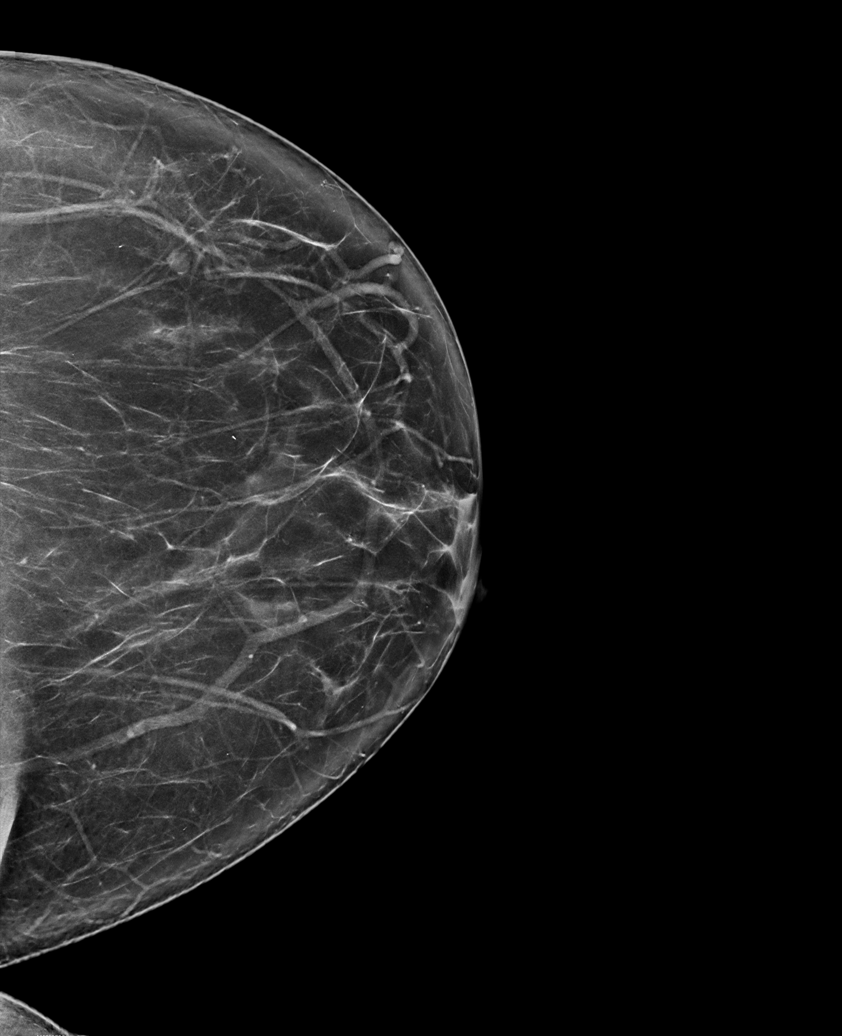

[R MLO synth-2D]
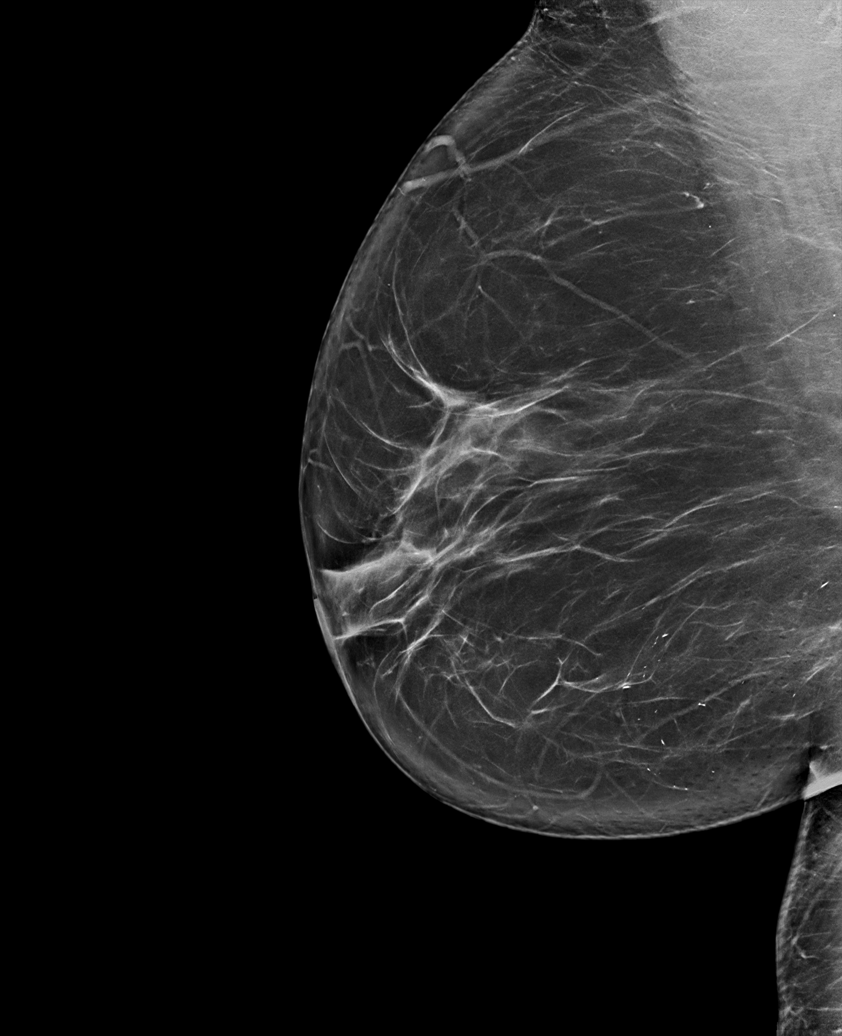

[L MLO synth-2D]
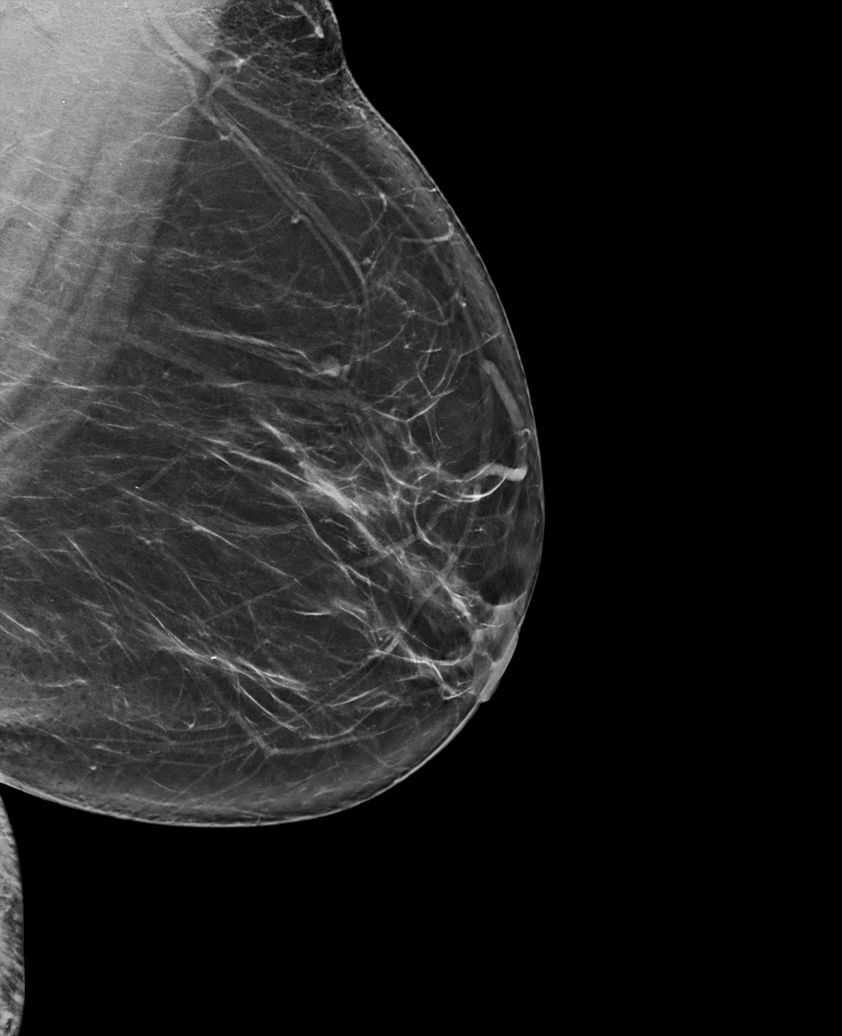

[L XCCL synth-2D]
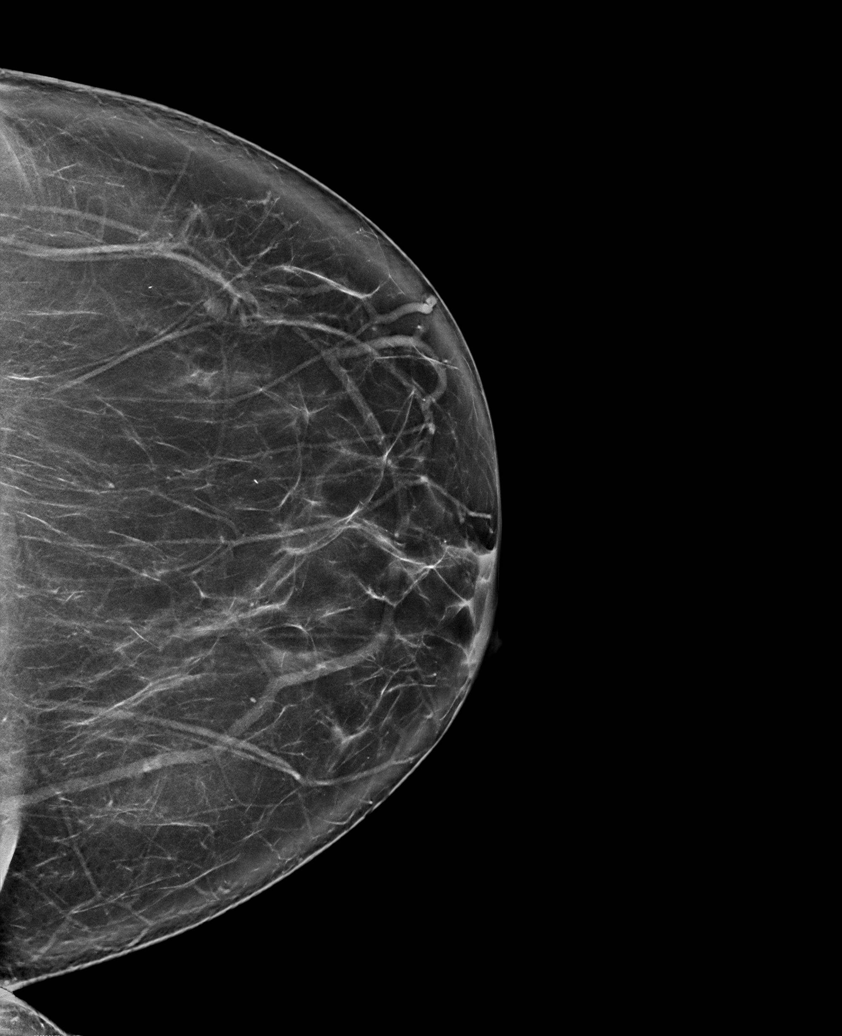

[R CC synth-2D]
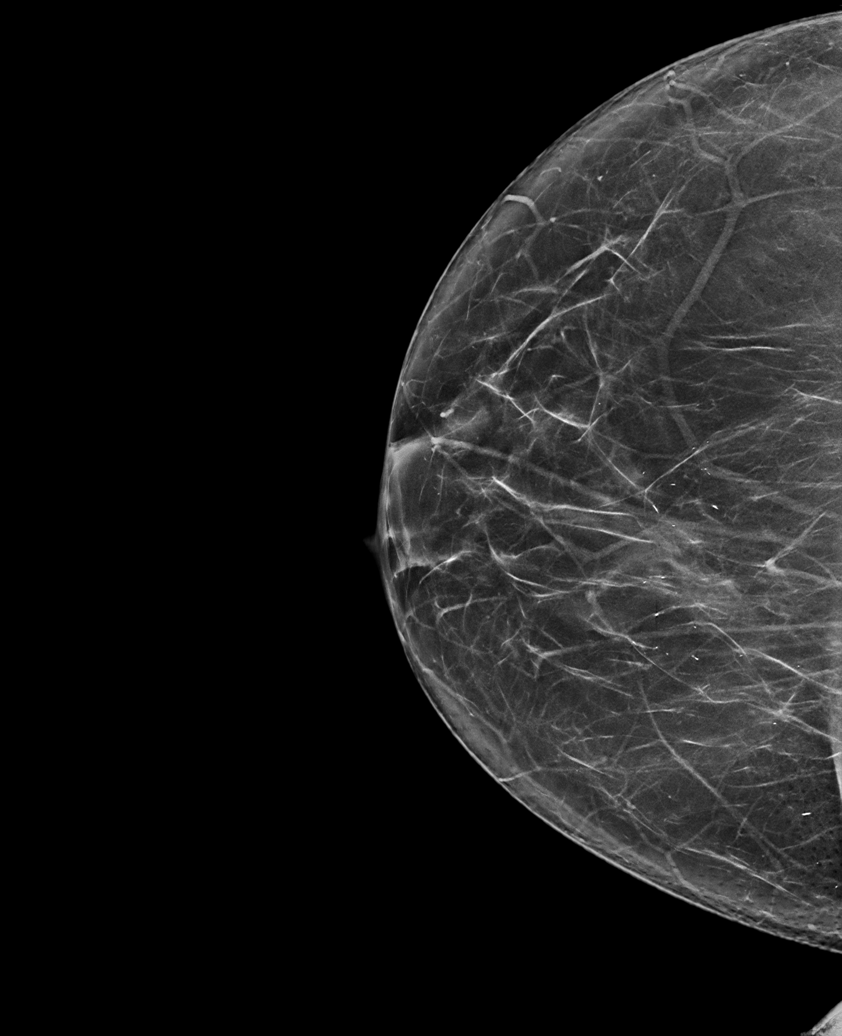

[R CC tomo · tomo slice 41/81.0]
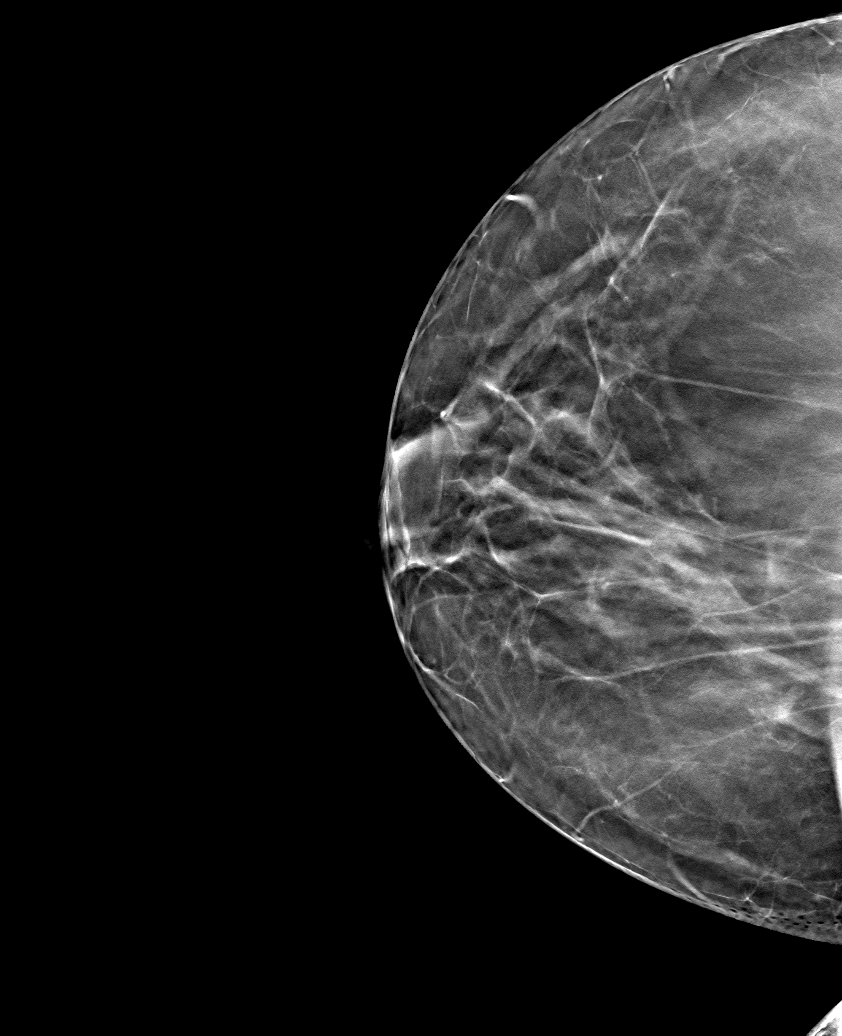

[6 of 30 positions shown; findings below may reference images not displayed]

ACR Breast Density Category b: There are scattered areas of
fibroglandular density.
FINDINGS: There are no findings suspicious for malignancy.
IMPRESSION: No mammographic evidence of malignancy. A result letter of this
screening mammogram will be mailed directly to the patient.

RECOMMENDATION:
Screening mammogram in one year. (Code:51-O-LD2)

BI-RADS CATEGORY  1: Negative.

## 2023-03-17 ENCOUNTER — Other Ambulatory Visit: Payer: Self-pay | Admitting: Family

## 2023-03-17 DIAGNOSIS — E559 Vitamin D deficiency, unspecified: Secondary | ICD-10-CM

## 2023-03-24 ENCOUNTER — Other Ambulatory Visit: Payer: Self-pay | Admitting: Family

## 2023-03-24 DIAGNOSIS — I10 Essential (primary) hypertension: Secondary | ICD-10-CM

## 2023-04-13 ENCOUNTER — Other Ambulatory Visit: Payer: Self-pay | Admitting: Family

## 2023-04-13 DIAGNOSIS — E559 Vitamin D deficiency, unspecified: Secondary | ICD-10-CM

## 2023-05-11 ENCOUNTER — Telehealth: Payer: 59

## 2023-05-11 ENCOUNTER — Other Ambulatory Visit: Payer: Self-pay | Admitting: Family

## 2023-05-11 ENCOUNTER — Ambulatory Visit (INDEPENDENT_AMBULATORY_CARE_PROVIDER_SITE_OTHER): Payer: 59 | Admitting: Family

## 2023-05-11 ENCOUNTER — Encounter: Payer: Self-pay | Admitting: Family

## 2023-05-11 VITALS — BP 128/82 | HR 61 | Temp 97.2°F | Resp 20 | Ht 63.0 in | Wt 228.6 lb

## 2023-05-11 DIAGNOSIS — F418 Other specified anxiety disorders: Secondary | ICD-10-CM

## 2023-05-11 DIAGNOSIS — I10 Essential (primary) hypertension: Secondary | ICD-10-CM

## 2023-05-11 DIAGNOSIS — E559 Vitamin D deficiency, unspecified: Secondary | ICD-10-CM

## 2023-05-11 DIAGNOSIS — J4520 Mild intermittent asthma, uncomplicated: Secondary | ICD-10-CM

## 2023-05-11 DIAGNOSIS — G43109 Migraine with aura, not intractable, without status migrainosus: Secondary | ICD-10-CM | POA: Diagnosis not present

## 2023-05-11 MED ORDER — HYDROCHLOROTHIAZIDE 25 MG PO TABS: 25.0000 mg | ORAL_TABLET | Freq: Every day | ORAL | 3 refills | Status: AC

## 2023-05-11 MED ORDER — MONTELUKAST SODIUM 10 MG PO TABS: 10.0000 mg | ORAL_TABLET | Freq: Every day | ORAL | 3 refills | Status: AC

## 2023-05-11 MED ORDER — SUMATRIPTAN SUCCINATE 25 MG PO TABS: 25.0000 mg | ORAL_TABLET | ORAL | 0 refills | Status: AC | PRN

## 2023-05-11 MED ORDER — ALBUTEROL SULFATE HFA 108 (90 BASE) MCG/ACT IN AERS: 2.0000 | INHALATION_SPRAY | Freq: Four times a day (QID) | RESPIRATORY_TRACT | 5 refills | Status: AC | PRN

## 2023-05-11 NOTE — Telephone Encounter (Signed)
Patient is requesting a refill of the following medications: Requested Prescriptions   Pending Prescriptions Disp Refills   sertraline (ZOLOFT) 50 MG tablet [Pharmacy Med Name: Sertraline HCl 50 MG Oral Tablet] 90 tablet 0    Sig: Take 1 tablet by mouth once daily   Vitamin D, Ergocalciferol, (DRISDOL) 1.25 MG (50000 UNIT) CAPS capsule [Pharmacy Med Name: Vitamin D (Ergocalciferol) 1.25 MG (50000 UT) Oral Capsule] 8 capsule 0    Sig: TAKE 1 CAPSULE BY MOUTH TWICE A WEEK    Date of last refill:07/24/2022  Refill amount: 90 tablets 1 refill  Treatment agreement date: n/a

## 2023-05-11 NOTE — Telephone Encounter (Signed)
Sent in error

## 2023-05-11 NOTE — Telephone Encounter (Signed)
Noted  

## 2023-05-11 NOTE — Progress Notes (Signed)
Provider: Richarda Blade FNP-C  Peter Keyworth, Donalee Citrin, NP  Patient Care Team: Shanna Un, Donalee Citrin, NP as PCP - General (Family Medicine) Corbin Ade, MD (Gastroenterology)  Extended Emergency Contact Information Primary Emergency Contact: Dillard,Katrina Address: [address]          [place], [place] [address] [place] Home Phone: [phone] Relation: Sister  Code Status:  Full Code  Goals of care: Advanced Directive information    05/11/2023    1:39 PM  Advanced Directives  Does Patient Have a Medical Advance Directive? No  Would patient like information on creating a medical advance directive? No - Patient declined     Chief Complaint  Patient presents with   Acute Visit    Migraine     HPI:  Pt is a 62 y.o. female seen today for an acute visit for evaluation of migraine which started today.Had episode of nausea.Has had migraine in the past which was related to menopausal. Also stopped her Norvasc due to swelling on the legs which has resolved.  No home blood pressure readings for evaluation.she denies any headache,dizziness,vision changes,fatigue,chest tightness,palpitation,chest pain or shortness of breath.       Past Medical History:  Diagnosis Date   Acute pancreatitis 04/10/2011   ?secondary to HCTZ.    Anxiety    per new patient form   Arthritis    Asthma    Complication of anesthesia    pt. states that with previous surgery, she was difficult to get to sleep   GERD (gastroesophageal reflux disease)    H/O hiatal hernia    Headache    history of migraines   High cholesterol    Per new patient form   History of depression 07/10/2006   History of pneumonia 07/11/1995   Hyperlipidemia    stopped statins due to muscle pain   Hypertension    Hypothyroidism    pt. 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 Jeani Hawking. Per pt, colon polyps and due in five years for f/u.   DG  BONE DENSITY ([facility] HX)  2020   Per new patient form   DIAGNOSTIC MAMMOGRAM  2021   Per new patient form   ESOPHAGOGASTRODUODENOSCOPY  1990s   duodenitis per patient   ESOPHAGOGASTRODUODENOSCOPY  05/24/2011   Procedure: ESOPHAGOGASTRODUODENOSCOPY (EGD);  Surgeon: Corbin Ade, MD;  Location: [facility];  Service: Endoscopy;  Laterality: N/A;  1:05   REPLACEMENT TOTAL KNEE Left  2021   Per new patient form   TONSILLECTOMY  1984   TOTAL ABDOMINAL HYSTERECTOMY  1991   TRIGGER FINGER RELEASE Right 2015   thumb    Allergies  Allergen Reactions   Percocet [Oxycodone-Acetaminophen] Itching    Terrible itching   Statins     Joints ache   Codeine Itching    Bad itching    Outpatient Encounter Medications as of 05/11/2023  Medication Sig   busPIRone (BUSPAR) 5 MG tablet Take 1 tablet by mouth twice daily   colchicine 0.6 MG tablet Take 1.2 mg tablet by mouth x 1 dose then 0.6 mg tablet one hour later maximum 1.8 mg  total.   diphenhydrAMINE (SOMINEX) 25 MG tablet Take 25-50 mg by mouth at bedtime as needed for sleep.   ezetimibe (ZETIA) 10 MG tablet Take 1 tablet (10 mg total) by mouth daily.   hydrALAZINE (APRESOLINE) 50 MG tablet Take 1 tablet (50 mg total) by mouth daily.   hydrochlorothiazide (HYDRODIURIL) 25 MG tablet Take 1 tablet (25 mg total) by mouth daily.   Ipratropium-Albuterol (DUONEB IN) Inhale 1 Inhaler into the lungs.   Loperamide HCl (IMODIUM PO) Take by mouth as needed.   losartan (COZAAR) 100 MG tablet Take 1 tablet (100 mg total) by mouth daily.   Melatonin 12 MG TABS Take by mouth at bedtime as needed.   metoprolol succinate (TOPROL-XL) 100 MG 24 hr tablet Take 1 tablet (100 mg total) by mouth daily. Take with or immediately following a meal.   montelukast (SINGULAIR) 10 MG tablet Take 1 tablet (10 mg total) by mouth at bedtime.   Omega-3 Fatty Acids (FISH OIL PO) Take by mouth.   SUMAtriptan (IMITREX) 25 MG tablet Take 1 tablet (25 mg total) by mouth every 2 (two) hours as needed for migraine. May repeat in 2 hours if headache persists or recurs.   Vitamin D, Ergocalciferol, (DRISDOL) 1.25 MG (50000 UNIT) CAPS capsule TAKE 1 CAPSULE BY MOUTH TWICE A WEEK   [DISCONTINUED] albuterol (VENTOLIN HFA) 108 (90 Base) MCG/ACT inhaler Inhale 2 puffs into the lungs every 6 (six) hours as needed for wheezing or shortness of breath.   [DISCONTINUED] hydrochlorothiazide (MICROZIDE) 12.5 MG capsule Take 1 capsule (12.5 mg total) by mouth daily.   albuterol (VENTOLIN HFA) 108 (90 Base) MCG/ACT inhaler Inhale 2 puffs into the lungs every 6 (six) hours as needed for wheezing or shortness of breath.   amLODipine (NORVASC) 5 MG tablet Take 1 tablet by mouth once daily (Patient not taking: Reported on 05/11/2023)   aspirin EC 81 MG tablet Take 1 tablet (81 mg total) by mouth daily. Swallow whole. (Patient not taking: Reported on 05/11/2023)   esomeprazole (NEXIUM) 20 MG capsule Take 20 mg by mouth daily at 12  noon.   sertraline (ZOLOFT) 50 MG tablet Take 1 tablet by mouth once daily (Patient not taking: Reported on 05/11/2023)   No facility-administered encounter medications on file as of 05/11/2023.    Review of Systems  Constitutional:  Negative for appetite change, chills, fatigue, fever and unexpected weight change.  HENT:  Negative for congestion, dental problem, ear discharge, ear pain, facial swelling, hearing loss, nosebleeds, postnasal drip, rhinorrhea, sinus pressure, sinus pain, sneezing, sore throat, tinnitus and trouble swallowing.   Eyes:  Negative for pain, discharge, redness, itching and visual disturbance.  Respiratory:  Negative for cough, chest tightness, shortness of breath and wheezing.   Cardiovascular:  Negative for chest pain, palpitations and leg swelling.  Gastrointestinal:  Positive  for nausea. Negative for abdominal distention, abdominal pain, blood in stool, constipation, diarrhea and vomiting.  Genitourinary:  Negative for difficulty urinating, dysuria, flank pain, frequency and urgency.  Musculoskeletal:  Negative for arthralgias, back pain, gait problem, joint swelling, myalgias, neck pain and neck stiffness.  Skin:  Negative for color change, pallor, rash and wound.  Neurological:  Positive for headaches. Negative for dizziness, syncope, speech difficulty, weakness, light-headedness and numbness.  Hematological:  Does not bruise/bleed easily.  Psychiatric/Behavioral:  Negative for agitation, behavioral problems, confusion, hallucinations and sleep disturbance. The patient is not nervous/anxious.     Immunization History  Administered Date(s) Administered   Influenza Split 04/10/2016, 04/18/2018   Influenza-Unspecified 04/24/2017, 04/15/2019, 04/29/2020, 04/28/2021, 05/04/2022, 05/07/2023   Moderna Sars-Covid-2 Vaccination 09/25/2019, 10/23/2019   PPD Test 09/05/2022   Pfizer Covid-19 Vaccine Bivalent Booster 73yrs & up 11/30/2020   Tdap 07/11/2011   Pertinent   Health Maintenance Due  Topic Date Due   MAMMOGRAM  11/11/2023   Colonoscopy  10/18/2032   INFLUENZA VACCINE  Completed      04/17/2022   10:47 AM 05/01/2022    7:00 AM 07/24/2022   11:13 AM 01/23/2023    2:15 PM 05/11/2023    1:37 PM  Fall Risk  Falls in the past year?   0 0 0  Was there an injury with Fall?   0 0   Fall Risk Category Calculator   0 0   (RETIRED) Patient Fall Risk Level Low fall risk Low fall risk     Patient at Risk for Falls Due to   No Fall Risks History of fall(s) No Fall Risks  Fall risk Follow up   Falls evaluation completed Falls evaluation completed    Functional Status Survey:    Vitals:   05/11/23 1344  BP: 128/82  Pulse: 61  Resp: 20  Temp: (!) 97.2 F (36.2 C)  SpO2: 95%  Weight: 228 lb 9.6 oz (103.7 kg)  Height: 5\' 3"  (1.6 m)   Body mass index is 40.49 kg/m. Physical Exam Vitals reviewed.  Constitutional:      General: She is not in acute distress.    Appearance: Normal appearance. She is obese. She is not ill-appearing or diaphoretic.  HENT:     Head: Normocephalic.     Right Ear: Tympanic membrane, ear canal and external ear normal. There is no impacted cerumen.     Left Ear: Tympanic membrane, ear canal and external ear normal. There is no impacted cerumen.     Nose: Nose normal. No congestion or rhinorrhea.     Mouth/Throat:     Mouth: Mucous membranes are moist.     Pharynx: Oropharynx is clear. No oropharyngeal exudate or posterior oropharyngeal erythema.  Eyes:     General: No scleral icterus.       Right eye: No discharge.        Left eye: No discharge.     Extraocular Movements: Extraocular movements intact.     Conjunctiva/sclera: Conjunctivae normal.     Pupils: Pupils are equal, round, and reactive to light.  Neck:     Vascular: No carotid bruit.  Cardiovascular:     Rate and Rhythm: Normal rate and regular rhythm.     Pulses: Normal pulses.     Heart sounds: Normal heart sounds. No murmur heard.    No friction rub.  No gallop.  Pulmonary:     Effort: Pulmonary effort is normal. No respiratory distress.     Breath sounds:  Normal breath sounds. No wheezing, rhonchi or rales.  Chest:     Chest wall: No tenderness.  Abdominal:     General: Bowel sounds are normal. There is no distension.     Palpations: Abdomen is soft. There is no mass.     Tenderness: There is no abdominal tenderness. There is no right CVA tenderness, left CVA tenderness, guarding or rebound.  Musculoskeletal:        General: No swelling or tenderness. Normal range of motion.     Cervical back: Normal range of motion. No rigidity or tenderness.     Right lower leg: No edema.     Left lower leg: No edema.  Lymphadenopathy:     Cervical: No cervical adenopathy.  Skin:    General: Skin is warm and dry.     Coloration: Skin is not pale.     Findings: No bruising, erythema, lesion or rash.  Neurological:     Mental Status: She is alert and oriented to person, place, and time.     Cranial Nerves: No cranial nerve deficit.     Sensory: No sensory deficit.     Motor: No weakness.     Coordination: Coordination normal.     Gait: Gait normal.  Psychiatric:        Mood and Affect: Mood normal.        Speech: Speech normal.        Behavior: Behavior normal.     Labs reviewed: Recent Labs    07/24/22 1205 07/30/22 1810 01/26/23 1100  NA 142 140 140  K 3.5 3.0* 3.6  CL 102 104 103  CO2 25 24 27   GLUCOSE 90 126* 106*  BUN 17 16 10   CREATININE 0.91 0.96 0.79  CALCIUM 10.1 9.4 9.6   Recent Labs    07/24/22 1205 07/30/22 1810 01/26/23 1100  AST 18 36 16  ALT 8 14 8   ALKPHOS  --  64  --   BILITOT 0.8 1.0 0.8  PROT 7.5 7.5 6.9  ALBUMIN  --  4.2  --    Recent Labs    07/24/22 1205 07/30/22 1810 01/26/23 1100  WBC 9.2 16.4* 9.8  NEUTROABS 6,477 14.0* 7,370  HGB 13.1 12.7 13.1  HCT 39.0 38.0 40.1  MCV 92.4 91.6 95.7  PLT 291 335 245   Lab Results  Component Value Date   TSH 1.34 01/26/2023   No results found  for: "HGBA1C" Lab Results  Component Value Date   CHOL 240 (H) 03/13/2023   HDL 54 03/13/2023   LDLCALC 155 (H) 03/13/2023   TRIG 169 (H) 03/13/2023   CHOLHDL 4.4 03/13/2023    Significant Diagnostic Results in last 30 days:  No results found.  Assessment/Plan  1. Essential hypertension B/p well controlled  - continue on losartan,metoprolol,Hydralazine and HCZT - hydrochlorothiazide (HYDRODIURIL) 25 MG tablet; Take 1 tablet (25 mg total) by mouth daily.  Dispense: 90 tablet; Refill: 3  2. Mild intermittent asthma without complication - add montelukast  - montelukast (SINGULAIR) 10 MG tablet; Take 1 tablet (10 mg total) by mouth at bedtime.  Dispense: 30 tablet; Refill: 3 - albuterol (VENTOLIN HFA) 108 (90 Base) MCG/ACT inhaler; Inhale 2 puffs into the lungs every 6 (six) hours as needed for wheezing or shortness of breath.  Dispense: 1 each; Refill: 5  Family/ staff Communication: Reviewed plan of care with patient verbalized understanding   Labs/tests ordered: None   Next Appointment: Return if symptoms worsen or fail to  improve.   Caesar Bookman, NP

## 2023-05-18 ENCOUNTER — Ambulatory Visit
Admission: RE | Admit: 2023-05-18 | Discharge: 2023-05-18 | Disposition: A | Discharge status: Home / Self Care | Payer: 59 | Source: Ambulatory Visit | Attending: Emergency Medicine | Admitting: Emergency Medicine

## 2023-05-18 ENCOUNTER — Other Ambulatory Visit: Payer: Self-pay

## 2023-05-18 VITALS — BP 123/80 | HR 73 | Temp 99.1°F | Resp 20 | Ht 63.0 in | Wt 222.0 lb

## 2023-05-18 DIAGNOSIS — J069 Acute upper respiratory infection, unspecified: Secondary | ICD-10-CM

## 2023-05-18 DIAGNOSIS — Z8709 Personal history of other diseases of the respiratory system: Secondary | ICD-10-CM

## 2023-05-18 DIAGNOSIS — R051 Acute cough: Secondary | ICD-10-CM | POA: Diagnosis not present

## 2023-05-18 MED ORDER — DOXYCYCLINE HYCLATE 100 MG PO CAPS
100.0000 mg | ORAL_CAPSULE | Freq: Two times a day (BID) | ORAL | 0 refills | Status: AC
Start: 2023-05-18 — End: 2023-05-25

## 2023-05-18 NOTE — Discharge Instructions (Addendum)
Rest,push fluids,may try using over the counter throat lozenges, mucinex or sudafed as label directed, hot tea, honey for cough.  Take antibiotic may cause upset stomach, resistance, photosensitivity.  Take home meds as directed

## 2023-05-18 NOTE — ED Provider Notes (Signed)
Joan Salazar    CSN: [id number] Arrival date & time: 05/18/23  1339      History   Chief Complaint Chief Complaint  Patient presents with   Cough    Entered by patient    HPI Joan Salazar is a 62 y.o. female.   63 year old female pt, Joan Salazar, presents to urgent care for evaluation of productive cough x 1 week, pt reports lots of congestion(yellow/green).  Was seen by PCP Monday was told to use nasal spray and Singulair, patient is not improving, cute URI is getting worse.  The history is provided by the patient. No language interpreter was used.    Past Medical History:  Diagnosis Date   Acute pancreatitis 04/10/2011   ?secondary to HCTZ.    Anxiety    per new patient form   Arthritis    Asthma    Complication of anesthesia    pt. states that with previous surgery, she was difficult to get to sleep   GERD (gastroesophageal reflux disease)    H/O hiatal hernia    Headache    history of migraines   High cholesterol    Per new patient form   History of depression 07/10/2006   History of pneumonia 07/11/1995   Hyperlipidemia    stopped statins due to muscle pain   Hypertension    Hypothyroidism    pt. 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   Degenerative disc disease, cervical 10/15/2014   Right foot pain 12/04/2013   Abdominal pain 05/16/2011   Acute pancreatitis 05/04/2011    Class: Hospitalized for   Abnormal LFTs 05/04/2011   Diarrhea 05/04/2011   HYPERLIPIDEMIA-MIXED 06/16/2009   HYPERTENSION, MALIGNANT, UNCONTROLLED 06/16/2009   FATIGUE / MALAISE 06/16/2009    PALPITATIONS 06/16/2009    Past Surgical History:  Procedure Laterality Date   ANTERIOR CERVICAL DECOMP/DISCECTOMY FUSION N/A 05/27/2015   Procedure: Anterior Cervical Diskectomy and Fusion-Cervical four-five, Cervical five-six ;  Surgeon: Tia Alert, MD;  Location: [facility]UR[facility];  Service: Neurosurgery;  Laterality: N/A;   APPENDECTOMY  1979   CARDIAC CATHETERIZATION  2005   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 Jeani Hawking. Per pt, colon polyps and due in five years for f/u.   DG  BONE DENSITY ([facility] HX)  2020   Per new patient form   DIAGNOSTIC MAMMOGRAM  2021   Per new patient form   ESOPHAGOGASTRODUODENOSCOPY  1990s   duodenitis per patient   ESOPHAGOGASTRODUODENOSCOPY  05/24/2011   Procedure: ESOPHAGOGASTRODUODENOSCOPY (EGD);  Surgeon: Corbin Ade, MD;  Location: [facility];  Service: Endoscopy;  Laterality: N/A;  1:05   REPLACEMENT TOTAL KNEE Left 2021   Per new patient form   TONSILLECTOMY  1984   TOTAL ABDOMINAL HYSTERECTOMY  1991   TRIGGER FINGER RELEASE Right 2015   thumb    OB History   No obstetric history on file.      Home Medications    Prior to Admission medications   Medication Sig Start Date End Date Taking? Authorizing Provider  doxycycline (VIBRAMYCIN) 100 MG capsule Take 1 capsule (100 mg total) by mouth 2 (two) times daily for 7 days. 05/18/23 05/25/23 Yes Orlena Garmon, Para March, NP  albuterol (VENTOLIN HFA) 108  (90 Base) MCG/ACT inhaler Inhale 2 puffs into the lungs every 6 (six) hours as needed for wheezing or shortness of breath. 05/11/23   Ngetich, Dinah C, NP  amLODipine (NORVASC) 5 MG tablet Take 1 tablet by mouth once daily Patient not taking: Reported on 05/11/2023 03/26/23   Ngetich, Dinah C, NP  aspirin EC 81 MG tablet Take 1 tablet (81 mg total) by mouth daily. Swallow whole. Patient not taking: Reported on 05/11/2023 07/24/22   Ngetich, Donalee Citrin, NP  busPIRone (BUSPAR) 5 MG tablet Take 1 tablet by mouth twice daily 03/08/23   Ngetich, Dinah C, NP  colchicine 0.6 MG tablet Take 1.2 mg tablet by mouth x 1 dose then 0.6 mg tablet one hour later maximum 1.8 mg total. 01/30/23   Ngetich, Dinah C, NP  diphenhydrAMINE (SOMINEX) 25 MG tablet Take 25-50 mg by mouth at bedtime as needed for sleep.    [provider]  esomeprazole (NEXIUM) 20 MG capsule Take 20 mg by mouth daily at 12 noon.    [provider]  ezetimibe (ZETIA) 10 MG tablet Take 1 tablet (10 mg total) by mouth daily. 02/13/23   Ngetich, Dinah C, NP  hydrALAZINE (APRESOLINE) 50 MG tablet Take 1 tablet (50 mg total) by mouth daily. 02/13/23 08/12/23  Ngetich, Dinah C, NP  hydrochlorothiazide (HYDRODIURIL) 25 MG tablet Take 1 tablet (25 mg total) by mouth daily. 05/11/23   Ngetich, Dinah C, NP  Ipratropium-Albuterol (DUONEB IN) Inhale 1 Inhaler into the lungs.    [provider]  Loperamide HCl (IMODIUM PO) Take by mouth as needed.    [provider]  losartan (COZAAR) 100 MG tablet Take 1 tablet (100 mg total) by mouth daily. 02/13/23   Ngetich, Dinah C, NP  Melatonin 12 MG TABS Take by mouth at bedtime as needed.    [provider]  metoprolol succinate (TOPROL-XL) 100 MG 24 hr tablet Take 1 tablet (100 mg total) by mouth daily. Take with or immediately following a meal. 02/13/23   Ngetich, Dinah C, NP  montelukast (SINGULAIR) 10 MG tablet Take 1 tablet (10 mg total) by mouth at bedtime. 05/11/23   Ngetich, Dinah C,  NP  Omega-3 Fatty Acids (FISH OIL PO) Take by mouth.    [provider]  sertraline (ZOLOFT) 50 MG tablet Take 1 tablet by mouth once daily Patient not taking: Reported on 05/11/2023 05/11/23   Ngetich, Dinah C, NP  SUMAtriptan (IMITREX) 25 MG tablet Take 1 tablet (25 mg total) by mouth every 2 (two) hours as needed for migraine. May repeat in 2 hours if headache persists or recurs. 05/11/23   Ngetich, Dinah C, NP  Vitamin D, Ergocalciferol, (DRISDOL) 1.25 MG (50000 UNIT) CAPS capsule TAKE 1 CAPSULE BY MOUTH TWICE A WEEK 05/11/23   Ngetich, Donalee Citrin, NP  Family History Family History  Problem Relation Age of Onset   Hypertension Mother    Heart attack Mother        2008   Ulcerative colitis Mother    Arthritis Mother    Arthritis Father    Cancer Father    Hypertension Father    Hyperlipidemia Father    Cancer Sister    Hypertension Sister    High Cholesterol Sister    Hypertension Sister    Diverticulosis Sister    High Cholesterol Brother    Hypertension Brother    Cancer Maternal Grandmother    Stroke Maternal Grandfather        1984   Breast cancer Paternal Grandmother        >50   Hypertension Son    Diverticulosis Son    Dementia Maternal Aunt    Dementia Maternal Uncle    Colon cancer Neg Hx    Liver disease Neg Hx     Social History Social History   Tobacco Use   Smoking status: Never   Smokeless tobacco: Never  Vaping Use   Vaping status: Never Used  Substance Use Topics   Alcohol use: No   Drug use: No     Allergies   Percocet [oxycodone-acetaminophen], Statins, and Codeine   Review of Systems Review of Systems  Constitutional:  Positive for fever.  HENT:  Positive for congestion.   Respiratory:  Positive for cough. Negative for shortness of breath, wheezing and stridor.   All other systems reviewed and are negative.    Physical Exam Triage Vital Signs ED Triage Vitals [05/18/23 1352]  Encounter Vitals Group     BP 123/80      Systolic BP Percentile      Diastolic BP Percentile      Pulse Rate 73     Resp 20     Temp 99.1 F (37.3 C)     Temp Source Oral     SpO2 96 %     Weight 222 lb (100.7 kg)     Height 5\' 3"  (1.6 m)     Head Circumference      Peak Flow      Pain Score 7     Pain Loc      Pain Education      Exclude from Growth Chart    No data found.  Updated Vital Signs BP 123/80   Pulse 73   Temp 99.1 F (37.3 C) (Oral)   Resp 20   Ht 5\' 3"  (1.6 m)   Wt 222 lb (100.7 kg)   SpO2 96%   BMI 39.33 kg/m   Visual Acuity Right Eye Distance:   Left Eye Distance:   Bilateral Distance:    Right Eye Near:   Left Eye Near:    Bilateral Near:     Physical Exam Vitals and nursing note reviewed.  Constitutional:      General: She is not in acute distress.    Appearance: She is well-developed and well-groomed.  HENT:     Head: Normocephalic and atraumatic.     Right Ear: Tympanic membrane is retracted.     Left Ear: Tympanic membrane is retracted.     Nose: Mucosal edema and congestion present.     Right Sinus: Maxillary sinus tenderness present.     Left Sinus: Maxillary sinus tenderness present.     Mouth/Throat:     Lips: Pink.     Mouth: Mucous membranes are moist.  Pharynx: Oropharynx is clear. Uvula midline. Postnasal drip present.  Eyes:     Conjunctiva/sclera: Conjunctivae normal.  Cardiovascular:     Rate and Rhythm: Normal rate and regular rhythm.     Pulses: Normal pulses.     Heart sounds: Normal heart sounds. No murmur heard. Pulmonary:     Effort: Pulmonary effort is normal. No respiratory distress.     Breath sounds: Normal breath sounds and air entry.  Abdominal:     Palpations: Abdomen is soft.     Tenderness: There is no abdominal tenderness.  Musculoskeletal:        General: No swelling.     Cervical back: Neck supple.  Skin:    General: Skin is warm and dry.     Capillary Refill: Capillary refill takes less than 2 seconds.  Neurological:     General:  No focal deficit present.     Mental Status: She is alert and oriented to person, place, and time.     GCS: GCS eye subscore is 4. GCS verbal subscore is 5. GCS motor subscore is 6.     Cranial Nerves: No cranial nerve deficit.     Sensory: No sensory deficit.  Psychiatric:        Attention and Perception: Attention normal.        Mood and Affect: Mood normal.        Speech: Speech normal.        Behavior: Behavior normal. Behavior is cooperative.      UC Treatments / Results  Labs (all labs ordered are listed, but only abnormal results are displayed) Labs Reviewed - No data to display  EKG   Radiology No results found.  Procedures Procedures (including critical care time)  Medications Ordered in UC Medications - No data to display  Initial Impression / Assessment and Plan / UC Course  I have reviewed the triage vital signs and the nursing notes.  Pertinent labs & imaging results that were available during my care of the patient were reviewed by me and considered in my medical decision making (see chart for details).    Discussed exam findings and plan of care with patient, strict go to ER precautions given.   Patient verbalized understanding to this provider.  Ddx: Acute URI, cough, sinusitis ,history of asthma Final Clinical Impressions(s) / UC Diagnoses   Final diagnoses:  Acute URI  Acute cough  History of asthma     Discharge Instructions      Rest,push fluids,may try using over the counter throat lozenges, mucinex or sudafed as label directed, hot tea, honey for cough.  Take antibiotic may cause upset stomach, resistance, photosensitivity.  Take home meds as directed     ED Prescriptions     Medication Sig Dispense Auth. Provider   doxycycline (VIBRAMYCIN) 100 MG capsule Take 1 capsule (100 mg total) by mouth 2 (two) times daily for 7 days. 14 capsule Jeet Shough, Para March, NP      PDMP not reviewed this encounter.   Clancy Gourd,  NP 05/18/23 1442

## 2023-05-18 NOTE — ED Triage Notes (Signed)
Pt reports cough X 1 week with yellow/green sputum.  Has had lot congestion.  Saw PCP Monday and was told to use nasal spray and Singulair.  Pt thought was asthma initially but getting worse and now having body aches.  Unlabored after ambulation to room.

## 2023-08-05 ENCOUNTER — Other Ambulatory Visit: Payer: Self-pay | Admitting: Family

## 2023-08-05 DIAGNOSIS — E559 Vitamin D deficiency, unspecified: Secondary | ICD-10-CM

## 2023-08-07 MED ORDER — VITAMIN D (ERGOCALCIFEROL) 1.25 MG (50000 UNIT) PO CAPS: ORAL_CAPSULE | ORAL | 0 refills | Status: AC

## 2023-08-07 NOTE — Addendum Note (Signed)
Addended by: Maurice Small on: 08/07/2023 07:42 AM   Modules accepted: Orders

## 2023-10-25 ENCOUNTER — Other Ambulatory Visit: Payer: Self-pay | Admitting: Family

## 2023-10-25 DIAGNOSIS — I10 Essential (primary) hypertension: Secondary | ICD-10-CM

## 2023-10-25 DIAGNOSIS — F418 Other specified anxiety disorders: Secondary | ICD-10-CM

## 2024-02-16 ENCOUNTER — Other Ambulatory Visit: Payer: Self-pay | Admitting: Family

## 2024-02-16 DIAGNOSIS — I10 Essential (primary) hypertension: Secondary | ICD-10-CM

## 2024-02-18 ENCOUNTER — Other Ambulatory Visit: Payer: Self-pay | Admitting: Family

## 2024-02-18 DIAGNOSIS — I10 Essential (primary) hypertension: Secondary | ICD-10-CM

## 2024-02-25 NOTE — Telephone Encounter (Signed)
 Good morning Joan Salazar    Our office was trying to reach you to schedule your yearly check up and Renal Ultrasound. Please call our office at [phone] if you would like to schedule.   Thank you in advance.  Cy Pinal

## 2024-02-27 DIAGNOSIS — E559 Vitamin D deficiency, unspecified: Secondary | ICD-10-CM | POA: Diagnosis not present

## 2024-02-27 DIAGNOSIS — R399 Unspecified symptoms and signs involving the genitourinary system: Secondary | ICD-10-CM | POA: Diagnosis not present

## 2024-02-27 DIAGNOSIS — E782 Mixed hyperlipidemia: Secondary | ICD-10-CM | POA: Diagnosis not present

## 2024-02-27 DIAGNOSIS — J45909 Unspecified asthma, uncomplicated: Secondary | ICD-10-CM | POA: Diagnosis not present

## 2024-02-27 DIAGNOSIS — E66812 Obesity, class 2: Secondary | ICD-10-CM | POA: Diagnosis not present

## 2024-02-27 DIAGNOSIS — F321 Major depressive disorder, single episode, moderate: Secondary | ICD-10-CM | POA: Diagnosis not present

## 2024-02-27 DIAGNOSIS — I10 Essential (primary) hypertension: Secondary | ICD-10-CM | POA: Diagnosis not present

## 2024-02-27 DIAGNOSIS — J3089 Other allergic rhinitis: Secondary | ICD-10-CM | POA: Diagnosis not present

## 2024-02-27 DIAGNOSIS — Z7189 Other specified counseling: Secondary | ICD-10-CM | POA: Diagnosis not present

## 2024-02-27 DIAGNOSIS — Z1231 Encounter for screening mammogram for malignant neoplasm of breast: Secondary | ICD-10-CM | POA: Diagnosis not present

## 2024-02-27 DIAGNOSIS — K219 Gastro-esophageal reflux disease without esophagitis: Secondary | ICD-10-CM | POA: Diagnosis not present

## 2024-02-27 DIAGNOSIS — Z131 Encounter for screening for diabetes mellitus: Secondary | ICD-10-CM | POA: Diagnosis not present

## 2024-02-27 DIAGNOSIS — Z1159 Encounter for screening for other viral diseases: Secondary | ICD-10-CM | POA: Diagnosis not present

## 2024-02-27 DIAGNOSIS — Z780 Asymptomatic menopausal state: Secondary | ICD-10-CM | POA: Diagnosis not present

## 2024-03-04 ENCOUNTER — Other Ambulatory Visit: Payer: Self-pay | Admitting: Internal Medicine

## 2024-03-04 DIAGNOSIS — Z1231 Encounter for screening mammogram for malignant neoplasm of breast: Secondary | ICD-10-CM

## 2024-03-04 DIAGNOSIS — Z780 Asymptomatic menopausal state: Secondary | ICD-10-CM

## 2024-05-28 ENCOUNTER — Other Ambulatory Visit: Payer: Self-pay | Admitting: Family

## 2024-05-28 DIAGNOSIS — I10 Essential (primary) hypertension: Secondary | ICD-10-CM

## 2024-05-28 NOTE — Telephone Encounter (Unsigned)
 Copied from CRM [id number]. Topic: Clinical - Medication Refill >> May 28, 2024 12:27 PM Shamecia H wrote: Medication: metoprolol  succinate (TOPROL -XL) 100 MG 24 hr tablet  Has the patient contacted their pharmacy? Yes (Agent: If no, request that the patient contact the pharmacy for the refill. If patient does not wish to contact the pharmacy document the reason why and proceed with request.) (Agent: If yes, when and what did the pharmacy advise?)  This is the patient's preferred pharmacy:  [facility] - [place], [place] - [address] [place] [address] Phone: [phone] Fax: [phone]  Is this the correct pharmacy for this prescription? Yes If no, delete pharmacy and type the correct one.   Has the prescription been filled recently? No  Is the patient out of the medication? No  Has the patient been seen for an appointment in the last year OR does the patient have an upcoming appointment? Yes  Can we respond through MyChart? Yes  Agent: Please be advised that Rx refills may take up to 3 business days. We ask that you follow-up with your pharmacy.

## 2024-05-29 ENCOUNTER — Ambulatory Visit
Admission: RE | Admit: 2024-05-29 | Discharge: 2024-05-29 | Disposition: A | Discharge status: Home / Self Care | Source: Ambulatory Visit | Attending: Internal Medicine | Admitting: Internal Medicine

## 2024-05-29 DIAGNOSIS — Z1231 Encounter for screening mammogram for malignant neoplasm of breast: Secondary | ICD-10-CM | POA: Diagnosis present

## 2024-05-29 DIAGNOSIS — Z780 Asymptomatic menopausal state: Secondary | ICD-10-CM | POA: Diagnosis present

## 2024-06-03 ENCOUNTER — Encounter: Payer: Self-pay | Admitting: Internal Medicine

## 2024-06-16 ENCOUNTER — Other Ambulatory Visit: Payer: Self-pay | Admitting: Internal Medicine

## 2024-06-16 DIAGNOSIS — R928 Other abnormal and inconclusive findings on diagnostic imaging of breast: Secondary | ICD-10-CM

## 2024-06-17 ENCOUNTER — Inpatient Hospital Stay: Admission: RE | Admit: 2024-06-17 | Discharge: 2024-06-17 | Attending: Internal Medicine

## 2024-06-17 DIAGNOSIS — R928 Other abnormal and inconclusive findings on diagnostic imaging of breast: Secondary | ICD-10-CM

## 2024-06-27 ENCOUNTER — Encounter: Payer: Self-pay | Admitting: Radiology

## 2024-07-09 ENCOUNTER — Other Ambulatory Visit: Payer: Self-pay | Admitting: Internal Medicine

## 2024-07-09 DIAGNOSIS — R928 Other abnormal and inconclusive findings on diagnostic imaging of breast: Secondary | ICD-10-CM

## 2024-07-15 ENCOUNTER — Other Ambulatory Visit: Payer: Self-pay | Admitting: Internal Medicine

## 2024-07-15 DIAGNOSIS — R928 Other abnormal and inconclusive findings on diagnostic imaging of breast: Secondary | ICD-10-CM

## 2024-07-16 ENCOUNTER — Ambulatory Visit
Admission: RE | Admit: 2024-07-16 | Discharge: 2024-07-16 | Disposition: A | Discharge status: Home / Self Care | Source: Ambulatory Visit | Attending: Internal Medicine | Admitting: Internal Medicine

## 2024-07-16 DIAGNOSIS — R928 Other abnormal and inconclusive findings on diagnostic imaging of breast: Secondary | ICD-10-CM | POA: Diagnosis present

## 2024-07-16 DIAGNOSIS — N6011 Diffuse cystic mastopathy of right breast: Secondary | ICD-10-CM | POA: Diagnosis present

## 2024-07-16 HISTORY — PX: BREAST BIOPSY: SHX20

## 2024-07-16 MED ORDER — LIDOCAINE 1 % OPTIME INJ - NO CHARGE
5.0000 mL | Freq: Once | INTRAMUSCULAR | Status: DC
  Filled 2024-07-16: qty 6

## 2024-07-16 MED ORDER — LIDOCAINE-EPINEPHRINE 1 %-1:100000 IJ SOLN
10.0000 mL | Freq: Once | INTRAMUSCULAR | Status: DC
  Filled 2024-07-16: qty 10

## 2024-07-16 MED ORDER — LIDOCAINE-EPINEPHRINE 1 %-1:100000 IJ SOLN
20.0000 mL | Freq: Once | INTRAMUSCULAR | Status: DC
  Filled 2024-07-16: qty 20

## 2024-07-16 MED ORDER — CHLOROPROCAINE HCL (PF) 3 % IJ SOLN
20.0000 mL | Freq: Once | INTRAMUSCULAR | Status: AC
  Administered 2024-07-16: 600 mg
  Filled 2024-07-16: qty 20

## 2024-07-17 LAB — SURGICAL PATHOLOGY
